# Patient Record
Sex: Male | Born: 1980 | Race: Black or African American | Hispanic: No | Marital: Single | Smoking: Current some day smoker
Health system: Southern US, Community
[De-identification: ages and names within clinical notes are randomized; demographics above are authoritative.]

---

## 2005-07-12 ENCOUNTER — Emergency Department: Payer: Self-pay | Admitting: Emergency Medicine

## 2007-03-04 ENCOUNTER — Emergency Department: Payer: Self-pay | Admitting: Emergency Medicine

## 2007-03-15 ENCOUNTER — Emergency Department: Payer: Self-pay | Admitting: Emergency Medicine

## 2007-08-12 ENCOUNTER — Emergency Department: Payer: Self-pay | Admitting: Emergency Medicine

## 2007-08-20 ENCOUNTER — Emergency Department: Payer: Self-pay | Admitting: Emergency Medicine

## 2007-11-27 ENCOUNTER — Emergency Department: Payer: Self-pay | Admitting: Internal Medicine

## 2008-02-19 ENCOUNTER — Emergency Department: Payer: Self-pay | Admitting: Emergency Medicine

## 2008-02-27 ENCOUNTER — Emergency Department: Payer: Self-pay | Admitting: Emergency Medicine

## 2008-03-05 ENCOUNTER — Emergency Department: Payer: Self-pay | Admitting: Emergency Medicine

## 2008-05-04 ENCOUNTER — Emergency Department: Payer: Self-pay | Admitting: Emergency Medicine

## 2008-06-13 ENCOUNTER — Emergency Department: Payer: Self-pay | Admitting: Unknown Physician Specialty

## 2008-09-19 ENCOUNTER — Emergency Department: Payer: Self-pay | Admitting: Emergency Medicine

## 2009-02-01 ENCOUNTER — Emergency Department: Payer: Self-pay | Admitting: Emergency Medicine

## 2012-05-12 ENCOUNTER — Emergency Department: Payer: Self-pay | Admitting: Emergency Medicine

## 2012-05-14 ENCOUNTER — Emergency Department: Payer: Self-pay | Admitting: Internal Medicine

## 2014-09-30 ENCOUNTER — Emergency Department: Payer: Self-pay

## 2014-09-30 ENCOUNTER — Emergency Department
Admission: EM | Admit: 2014-09-30 | Discharge: 2014-09-30 | Disposition: A | Discharge status: Home / Self Care | Payer: Self-pay | Attending: Emergency Medicine | Admitting: Emergency Medicine

## 2014-09-30 ENCOUNTER — Encounter: Payer: Self-pay | Admitting: Emergency Medicine

## 2014-09-30 DIAGNOSIS — Y9289 Other specified places as the place of occurrence of the external cause: Secondary | ICD-10-CM | POA: Insufficient documentation

## 2014-09-30 DIAGNOSIS — Y9389 Activity, other specified: Secondary | ICD-10-CM | POA: Insufficient documentation

## 2014-09-30 DIAGNOSIS — Y998 Other external cause status: Secondary | ICD-10-CM | POA: Insufficient documentation

## 2014-09-30 DIAGNOSIS — S301XXA Contusion of abdominal wall, initial encounter: Secondary | ICD-10-CM | POA: Insufficient documentation

## 2014-09-30 DIAGNOSIS — Z720 Tobacco use: Secondary | ICD-10-CM | POA: Insufficient documentation

## 2014-09-30 LAB — COMPREHENSIVE METABOLIC PANEL
ALK PHOS: 55 U/L (ref 38–126)
ALT: 15 U/L — ABNORMAL LOW (ref 17–63)
ANION GAP: 6 (ref 5–15)
AST: 25 U/L (ref 15–41)
Albumin: 3.5 g/dL (ref 3.5–5.0)
BUN: 10 mg/dL (ref 6–20)
CO2: 27 mmol/L (ref 22–32)
Calcium: 8.3 mg/dL — ABNORMAL LOW (ref 8.9–10.3)
Chloride: 103 mmol/L (ref 101–111)
Creatinine, Ser: 1 mg/dL (ref 0.61–1.24)
GFR calc Af Amer: >60 mL/min (ref >60)
GFR calc non Af Amer: >60 mL/min (ref >60)
Glucose, Bld: 87 mg/dL (ref 65–99)
POTASSIUM: 4 mmol/L (ref 3.5–5.1)
SODIUM: 136 mmol/L (ref 135–145)
Total Bilirubin: 0.3 mg/dL (ref 0.3–1.2)
Total Protein: 5.7 g/dL — ABNORMAL LOW (ref 6.5–8.1)

## 2014-09-30 LAB — CBC WITH DIFFERENTIAL/PLATELET
Basophils Absolute: 0 10*3/uL (ref 0–0.1)
Basophils Relative: 0 %
EOS ABS: 0.1 10*3/uL (ref 0–0.7)
Eosinophils Relative: 1 %
HCT: 41.9 % (ref 40.0–52.0)
HEMOGLOBIN: 13.7 g/dL (ref 13.0–18.0)
Lymphocytes Relative: 29 %
Lymphs Abs: 3.2 10*3/uL (ref 1.0–3.6)
MCH: 28.6 pg (ref 26.0–34.0)
MCHC: 32.7 g/dL (ref 32.0–36.0)
MCV: 87.6 fL (ref 80.0–100.0)
MONOS PCT: 5 %
Monocytes Absolute: 0.5 10*3/uL (ref 0.2–1.0)
NEUTROS ABS: 7.4 10*3/uL — AB (ref 1.4–6.5)
Neutrophils Relative %: 65 %
PLATELETS: 269 10*3/uL (ref 150–440)
RBC: 4.78 MIL/uL (ref 4.40–5.90)
RDW: 14.6 % — ABNORMAL HIGH (ref 11.5–14.5)
WBC: 11.3 10*3/uL — AB (ref 3.8–10.6)

## 2014-09-30 MED ORDER — LEVOFLOXACIN IN D5W 750 MG/150ML IV SOLN: 750.0000 mg | Freq: Once | INTRAVENOUS | Status: DC

## 2014-09-30 MED ORDER — IOHEXOL 300 MG/ML  SOLN
100.0000 mL | Freq: Once | INTRAMUSCULAR | Status: AC | PRN
  Administered 2014-09-30: 100 mL via INTRAVENOUS

## 2014-09-30 MED ORDER — IOHEXOL 240 MG/ML SOLN: 25.0000 mL | Freq: Once | INTRAMUSCULAR | Status: DC | PRN

## 2014-09-30 MED ORDER — IBUPROFEN 800 MG PO TABS: 800.0000 mg | ORAL_TABLET | Freq: Three times a day (TID) | ORAL | Status: DC | PRN
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
## 2014-09-30 NOTE — Discharge Instructions (Signed)
Hematoma A hematoma is a collection of blood under the skin, in an organ, in a body space, in a joint space, or in other tissue. The blood can clot to form a lump that you can see and feel. The lump is often firm and may sometimes become sore and tender. Most hematomas get better in a few days to weeks. However, some hematomas may be serious and require medical care. Hematomas can range in size from very small to very large. CAUSES  A hematoma can be caused by a blunt or penetrating injury. It can also be caused by spontaneous leakage from a blood vessel under the skin. Spontaneous leakage from a blood vessel is more likely to occur in older people, especially those taking blood thinners. Sometimes, a hematoma can develop after certain medical procedures. SIGNS AND SYMPTOMS   A firm lump on the body.  Possible pain and tenderness in the area.  Bruising.Blue, dark blue, purple-red, or yellowish skin may appear at the site of the hematoma if the hematoma is close to the surface of the skin. For hematomas in deeper tissues or body spaces, the signs and symptoms may be subtle. For example, an intra-abdominal hematoma may cause abdominal pain, weakness, fainting, and shortness of breath. An intracranial hematoma may cause a headache or symptoms such as weakness, trouble speaking, or a change in consciousness. DIAGNOSIS  A hematoma can usually be diagnosed based on your medical history and a physical exam. Imaging tests may be needed if your health care provider suspects a hematoma in deeper tissues or body spaces, such as the abdomen, head, or chest. These tests may include ultrasonography or a CT scan.  TREATMENT  Hematomas usually go away on their own over time. Rarely does the blood need to be drained out of the body. Large hematomas or those that may affect vital organs will sometimes need surgical drainage or monitoring. HOME CARE INSTRUCTIONS   Apply ice to the injured area:   Put ice in a  plastic bag.   Place a towel between your skin and the bag.   Leave the ice on for 20 minutes, 2-3 times a day for the first 1 to 2 days.   After the first 2 days, switch to using warm compresses on the hematoma.   Elevate the injured area to help decrease pain and swelling. Wrapping the area with an elastic bandage may also be helpful. Compression helps to reduce swelling and promotes shrinking of the hematoma. Make sure the bandage is not wrapped too tight.   If your hematoma is on a lower extremity and is painful, crutches may be helpful for a couple days.   Only take over-the-counter or prescription medicines as directed by your health care provider. SEEK IMMEDIATE MEDICAL CARE IF:   You have increasing pain, or your pain is not controlled with medicine.   You have a fever.   You have worsening swelling or discoloration.   Your skin over the hematoma breaks or starts bleeding.   Your hematoma is in your chest or abdomen and you have weakness, shortness of breath, or a change in consciousness.  Your hematoma is on your scalp (caused by a fall or injury) and you have a worsening headache or a change in alertness or consciousness. MAKE SURE YOU:   Understand these instructions.  Will watch your condition.  Will get help right away if you are not doing well or get worse. Document Released: 10/06/2003 Document Revised: 10/24/2012 Document Reviewed: 08/01/2012   [employer] Patient Information 2015 [place], [place]. This information is not intended to replace advice given to you by your health care provider. Make sure you discuss any questions you have with your health care provider.  Blunt Abdominal Trauma A blunt injury to the abdomen can cause pain. The pain is most likely from bruising and stretching of your muscles. This pain is often made worse with movement. Most often these injuries are not serious and get better within 1 week with rest and mild pain medicine. However,  internal organs (liver, spleen, kidneys) can be injured with blunt trauma. If you do not get better or if you get worse, further examination may be needed. Continue with your regular daily activities, but avoid any strenuous activities until your pain is improved. If your stomach is upset, stick to a clear liquid diet and slowly advance to solid food.  SEEK IMMEDIATE MEDICAL CARE IF:   You develop increasing pain, nausea, or repeated vomiting.  You develop chest pain or breathing difficulty.  You develop blood in the urine, vomit, or stool.  You develop weakness, fainting, fever, or other serious complaints. Document Released: 03/31/2004 Document Revised: 05/16/2011 Document Reviewed: 07/17/2008 [facility] Patient Information 2015 [place], [place]. This information is not intended to replace advice given to you by your health care provider. Make sure you discuss any questions you have with your health care provider.
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
## 2016-01-23 ENCOUNTER — Encounter: Payer: Self-pay | Admitting: Emergency Medicine

## 2016-01-23 ENCOUNTER — Emergency Department
Admission: EM | Admit: 2016-01-23 | Discharge: 2016-01-23 | Disposition: A | Discharge status: Home / Self Care | Payer: Self-pay | Attending: Emergency Medicine | Admitting: Emergency Medicine

## 2016-01-23 ENCOUNTER — Emergency Department: Payer: Self-pay

## 2016-01-23 DIAGNOSIS — Z791 Long term (current) use of non-steroidal anti-inflammatories (NSAID): Secondary | ICD-10-CM | POA: Insufficient documentation

## 2016-01-23 DIAGNOSIS — F17200 Nicotine dependence, unspecified, uncomplicated: Secondary | ICD-10-CM | POA: Insufficient documentation

## 2016-01-23 DIAGNOSIS — M2141 Flat foot [pes planus] (acquired), right foot: Secondary | ICD-10-CM | POA: Insufficient documentation

## 2016-01-23 DIAGNOSIS — M2142 Flat foot [pes planus] (acquired), left foot: Secondary | ICD-10-CM | POA: Insufficient documentation

## 2016-01-23 DIAGNOSIS — M79672 Pain in left foot: Secondary | ICD-10-CM

## 2016-01-23 MED ORDER — MELOXICAM 15 MG PO TABS: 15.0000 mg | ORAL_TABLET | Freq: Every day | ORAL | 0 refills | Status: DC

## 2016-01-23 NOTE — ED Triage Notes (Signed)
Pt ambulatory in triage states he has ankle pain that started about 2-3 weeks ago. Pain worse when going from sitting to standing. Pt denies any injury. Pt has compression brace on currently. Using ibuprofen occassionally

## 2016-01-23 NOTE — ED Notes (Signed)
Pt verbalized understanding of discharge instructions. NAD at this time. 

## 2016-01-23 NOTE — ED Provider Notes (Signed)
[facility] Emergency Department Provider Note  ____________________________________________  Time seen: Approximately 6:12 PM  I have reviewed the triage vital signs and the nursing notes.   HISTORY  Chief Complaint Ankle Pain    HPI Nathaniel Freeman is a 35 y.o. male who presents emergency department complaining of left foot pain. Patient states that he has "very flat feet" and he has noticed that over the past several months he has had increasing foot pain. Patient states that he has not had any known injury to the area. He reports that pain increases after standing for a long period of time. Patient has not tried any medication or orthotics for this complaint. He has not seen a podiatrist.   History reviewed. No pertinent past medical history.  There are no active problems to display for this patient.   History reviewed. No pertinent surgical history.  Prior to Admission medications   Medication Sig Start Date End Date Taking? Authorizing Provider  ibuprofen (ADVIL,MOTRIN) 800 MG tablet Take 1 tablet (800 mg total) by mouth every 8 (eight) hours as needed. 09/30/14   Charmayne Sheerharles M Beers, PA-C  meloxicam (MOBIC) 15 MG tablet Take 1 tablet (15 mg total) by mouth daily. 01/23/16   Delorise RoyalsJonathan D Jadie Comas, PA-C    Allergies Patient has no known allergies.  History reviewed. No pertinent family history.  Social History Social History  Substance Use Topics  . Smoking status: Current Every Day Smoker    Packs/day: 0.50  . Smokeless tobacco: Never Used  . Alcohol use Yes     Comment: occasional     Review of Systems  Constitutional: No fever/chills Cardiovascular: no chest pain. Respiratory: no cough. No SOB. Musculoskeletal: Positive for left foot pain Skin: Negative for rash, abrasions, lacerations, ecchymosis. Neurological: Negative for headaches, focal weakness or numbness. 10-point ROS otherwise  negative.  ____________________________________________   PHYSICAL EXAM:  VITAL SIGNS: ED Triage Vitals  Enc Vitals Group     BP 01/23/16 1653 116/73     Pulse Rate 01/23/16 1653 69     Resp 01/23/16 1653 18     Temp 01/23/16 1653 97.9 F (36.6 C)     Temp Source 01/23/16 1653 Oral     SpO2 01/23/16 1653 100 %     Weight 01/23/16 1654 193 lb (87.5 kg)     Height 01/23/16 1654 6\' 1"  (1.854 m)     Head Circumference --      Peak Flow --      Pain Score 01/23/16 1654 8     Pain Loc --      Pain Edu? --      Excl. in GC? --      Constitutional: Alert and oriented. Well appearing and in no acute distress. Eyes: Conjunctivae are normal. PERRL. EOMI. Head: Atraumatic. Cardiovascular: Normal rate, regular rhythm. Normal S1 and S2.  Good peripheral circulation. Respiratory: Normal respiratory effort without tachypnea or retractions. Lungs CTAB. Good air entry to the bases with no decreased or absent breath sounds. Musculoskeletal: Full range of motion to all extremities. No gross deformities appreciated.Patient's the are extremely flattening 2. Little to no arch is appreciated. Patient is nontender to palpation at this time. No deformity or edema. Dorsalis pedis pulse and cap refill intact 5 digits. Sensation intact 5 digits. Neurologic:  Normal speech and language. No gross focal neurologic deficits are appreciated.  Skin:  Skin is warm, dry and intact. No rash noted. Psychiatric: Mood and affect are normal. Speech and behavior  are normal. Patient exhibits appropriate insight and judgement.   ____________________________________________   LABS (all labs ordered are listed, but only abnormal results are displayed)  Labs Reviewed - No data to display ____________________________________________  EKG   ____________________________________________  RADIOLOGY Festus BarrenI, Soledad Budreau D Yessica Putnam, personally viewed and evaluated these images (plain radiographs) as part of my medical  decision making, as well as reviewing the written report by the radiologist.  Dg Ankle Complete Left  Result Date: 01/23/2016 CLINICAL DATA:  No known injury.  Pain for 3 weeks with walking. EXAM: LEFT ANKLE COMPLETE - 3+ VIEW COMPARISON:  None. FINDINGS: There is no evidence of fracture, dislocation, or joint effusion. There is no evidence of arthropathy or other focal bone abnormality. Soft tissues are unremarkable. IMPRESSION: No acute osseous injury of the left ankle. Electronically Signed   By: Elige KoHetal  Patel   On: 01/23/2016 17:45    ____________________________________________    PROCEDURES  Procedure(s) performed:    Procedures    Medications - No data to display   ____________________________________________   INITIAL IMPRESSION / ASSESSMENT AND PLAN / ED COURSE  Pertinent labs & imaging results that were available during my care of the patient were reviewed by me and considered in my medical decision making (see chart for details).  Review of the [place] CSRS was performed in accordance of the [facility] prior to dispensing any controlled drugs.  Clinical Course     Patient's diagnosis is consistent with Pes planus and left foot pain. Patient has extremely flat feet and has noticed increasing symptoms over the past several months. This is likely attributed to pes planus. X-ray reveals no acute osseous abnormality. Exam is reassuring. Patient will be referred to podiatry for further evaluation and management of chronic condition. Patient is given anti-inflammatories at this time for symptom control..  Patient is given ED precautions to return to the ED for any worsening or new symptoms.     ____________________________________________  FINAL CLINICAL IMPRESSION(S) / ED DIAGNOSES  Final diagnoses:  Pes planus of both feet  Foot pain, left      NEW MEDICATIONS STARTED DURING THIS VISIT:  New Prescriptions   MELOXICAM (MOBIC) 15 MG TABLET    Take 1 tablet (15 mg  total) by mouth daily.        This chart was dictated using voice recognition software/Dragon. Despite best efforts to proofread, errors can occur which can change the meaning. Any change was purely unintentional.    Racheal PatchesJonathan D Garik Diamant, PA-C 01/23/16 Rickey [id number]    Sharman CheekPhillip Stafford, MD 02/02/16 [phone]

## 2016-02-15 ENCOUNTER — Emergency Department
Admission: EM | Admit: 2016-02-15 | Discharge: 2016-02-15 | Disposition: A | Discharge status: Home / Self Care | Payer: Self-pay | Attending: Emergency Medicine | Admitting: Emergency Medicine

## 2016-02-15 DIAGNOSIS — F17200 Nicotine dependence, unspecified, uncomplicated: Secondary | ICD-10-CM | POA: Insufficient documentation

## 2016-02-15 DIAGNOSIS — H109 Unspecified conjunctivitis: Secondary | ICD-10-CM

## 2016-02-15 DIAGNOSIS — Z79899 Other long term (current) drug therapy: Secondary | ICD-10-CM | POA: Insufficient documentation

## 2016-02-15 DIAGNOSIS — Z791 Long term (current) use of non-steroidal anti-inflammatories (NSAID): Secondary | ICD-10-CM | POA: Insufficient documentation

## 2016-02-15 DIAGNOSIS — H1089 Other conjunctivitis: Secondary | ICD-10-CM | POA: Insufficient documentation

## 2016-02-15 MED ORDER — POLYMYXIN B-TRIMETHOPRIM 10000-0.1 UNIT/ML-% OP SOLN: 2.0000 [drp] | Freq: Four times a day (QID) | OPHTHALMIC | 0 refills | Status: DC

## 2016-02-15 NOTE — ED Notes (Signed)
Pt reports he thinks he has pink eye - he has had inflammation of the right eyelid with drainage for the past 2 days - pt states his eyes feel like there is sand in them - he reports having pink eye 3 years ago and this feels the same

## 2016-02-15 NOTE — ED Triage Notes (Signed)
Pt presents to ED with c/o possible conjunctivitis. Pt reports yesterday RIGHT eye became painful with itching, redness, and yellow drainage. Pt denies any c/o N/V/D or fever. Pt's RIGHT eye is red and slightly swollen.

## 2016-02-15 NOTE — ED Provider Notes (Signed)
[facility] Emergency Department Provider Note  ____________________________________________  Time seen: Approximately 9:15 PM  I have reviewed the triage vital signs and the nursing notes.   HISTORY  Chief Complaint Conjunctivitis    HPI Nathaniel Freeman is a 35 y.o. male who presents emergency department complaining of right eye redness, irritation, draining purulent matter. Patient states symptoms have been ongoing 2 days. He denies any visual changes. He does wear glasses but no contacts. No trauma to the eye. No other complaints.   History reviewed. No pertinent past medical history.  There are no active problems to display for this patient.   History reviewed. No pertinent surgical history.  Prior to Admission medications   Medication Sig Start Date End Date Taking? Authorizing Provider  ibuprofen (ADVIL,MOTRIN) 800 MG tablet Take 1 tablet (800 mg total) by mouth every 8 (eight) hours as needed. 09/30/14   Charmayne Sheerharles M Beers, PA-C  meloxicam (MOBIC) 15 MG tablet Take 1 tablet (15 mg total) by mouth daily. 01/23/16   Delorise RoyalsJonathan D Zylon Creamer, PA-C  trimethoprim-polymyxin b (POLYTRIM) ophthalmic solution Place 2 drops into the right eye every 6 (six) hours. 02/15/16   Delorise RoyalsJonathan D Micheal Murad, PA-C    Allergies Patient has no known allergies.  No family history on file.  Social History Social History  Substance Use Topics  . Smoking status: Current Every Day Smoker    Packs/day: 0.50  . Smokeless tobacco: Never Used  . Alcohol use Yes     Comment: occasional     Review of Systems  Constitutional: No fever/chills Eyes: No visual changes. Positive for right eye redness, irritation, purulent discharge ENT: No upper respiratory complaints. Cardiovascular: no chest pain. Respiratory: no cough. No SOB. Musculoskeletal: Negative for musculoskeletal pain. Skin: Negative for rash, abrasions, lacerations, ecchymosis. Neurological: Negative for headaches,  focal weakness or numbness. 10-point ROS otherwise negative.  ____________________________________________   PHYSICAL EXAM:  VITAL SIGNS: ED Triage Vitals  Enc Vitals Group     BP 02/15/16 2056 125/84     Pulse Rate 02/15/16 2056 95     Resp 02/15/16 2056 18     Temp 02/15/16 2056 98.3 F (36.8 C)     Temp Source 02/15/16 2056 Oral     SpO2 02/15/16 2056 96 %     Weight 02/15/16 2057 195 lb (88.5 kg)     Height 02/15/16 2057 6\' 1"  (1.854 m)     Head Circumference --      Peak Flow --      Pain Score 02/15/16 2104 7     Pain Loc --      Pain Edu? --      Excl. in GC? --      Constitutional: Alert and oriented. Well appearing and in no acute distress. Eyes: Conjunctiva on right is erythematous. Dried purulent drainage noted to lower eyelashes. Funduscopic exam is unremarkable bilaterally.Marland Kitchen. PERRL. EOMI. Head: Atraumatic. ENT:      Ears:       Nose: No congestion/rhinnorhea.      Mouth/Throat: Mucous membranes are moist.  Neck: No stridor.   Cardiovascular: Normal rate, regular rhythm. Normal S1 and S2.  Good peripheral circulation. Respiratory: Normal respiratory effort without tachypnea or retractions. Lungs CTAB. Good air entry to the bases with no decreased or absent breath sounds. Musculoskeletal: Full range of motion to all extremities. No gross deformities appreciated. Neurologic:  Normal speech and language. No gross focal neurologic deficits are appreciated.  Skin:  Skin is warm, dry and  intact. No rash noted. Psychiatric: Mood and affect are normal. Speech and behavior are normal. Patient exhibits appropriate insight and judgement.   ____________________________________________   LABS (all labs ordered are listed, but only abnormal results are displayed)  Labs Reviewed - No data to display ____________________________________________  EKG   ____________________________________________  RADIOLOGY   No results  found.  ____________________________________________    PROCEDURES  Procedure(s) performed:    Procedures    Medications - No data to display   ____________________________________________   INITIAL IMPRESSION / ASSESSMENT AND PLAN / ED COURSE  Pertinent labs & imaging results that were available during my care of the patient were reviewed by me and considered in my medical decision making (see chart for details).  Review of the [place] CSRS was performed in accordance of the [facility] prior to dispensing any controlled drugs.  Clinical Course     Patient's diagnosis is consistent with Pectoral conjunctivitis to the right eye.. Patient will be discharged home with prescriptions for antibiotic eyedrops. Patient is to follow up with primary care as needed or otherwise directed. Patient is given ED precautions to return to the ED for any worsening or new symptoms.     ____________________________________________  FINAL CLINICAL IMPRESSION(S) / ED DIAGNOSES  Final diagnoses:  Bacterial conjunctivitis of right eye      NEW MEDICATIONS STARTED DURING THIS VISIT:  New Prescriptions   TRIMETHOPRIM-POLYMYXIN B (POLYTRIM) OPHTHALMIC SOLUTION    Place 2 drops into the right eye every 6 (six) hours.        This chart was dictated using voice recognition software/Dragon. Despite best efforts to proofread, errors can occur which can change the meaning. Any change was purely unintentional.    Racheal PatchesJonathan D Samul Mcinroy, PA-C 02/15/16 2122    Rockne MenghiniAnne-Caroline Norman, MD 02/15/16 2207

## 2016-08-28 ENCOUNTER — Emergency Department
Admission: EM | Admit: 2016-08-28 | Discharge: 2016-08-28 | Disposition: A | Discharge status: Home / Self Care | Payer: Self-pay | Attending: Emergency Medicine | Admitting: Emergency Medicine

## 2016-08-28 ENCOUNTER — Encounter: Payer: Self-pay | Admitting: Emergency Medicine

## 2016-08-28 ENCOUNTER — Emergency Department: Payer: Self-pay

## 2016-08-28 DIAGNOSIS — Y9339 Activity, other involving climbing, rappelling and jumping off: Secondary | ICD-10-CM | POA: Insufficient documentation

## 2016-08-28 DIAGNOSIS — S838X2A Sprain of other specified parts of left knee, initial encounter: Secondary | ICD-10-CM

## 2016-08-28 DIAGNOSIS — S8392XA Sprain of unspecified site of left knee, initial encounter: Secondary | ICD-10-CM | POA: Insufficient documentation

## 2016-08-28 DIAGNOSIS — F17210 Nicotine dependence, cigarettes, uncomplicated: Secondary | ICD-10-CM | POA: Insufficient documentation

## 2016-08-28 DIAGNOSIS — W1789XA Other fall from one level to another, initial encounter: Secondary | ICD-10-CM | POA: Insufficient documentation

## 2016-08-28 DIAGNOSIS — Y929 Unspecified place or not applicable: Secondary | ICD-10-CM | POA: Insufficient documentation

## 2016-08-28 DIAGNOSIS — Y999 Unspecified external cause status: Secondary | ICD-10-CM | POA: Insufficient documentation

## 2016-08-28 MED ORDER — NAPROXEN 500 MG PO TABS: 500.0000 mg | ORAL_TABLET | Freq: Two times a day (BID) | ORAL | 0 refills | Status: AC

## 2016-08-28 NOTE — ED Provider Notes (Signed)
[facility] Emergency Department Provider Note ____________________________________________  Time seen: 0715  I have reviewed the triage vital signs and the nursing notes.  HISTORY  Chief Complaint  Knee Pain  HPI [place] is a 36 y.o. male presented to the ED for evaluationof left knee pain and disability. Patient describes jumping off of a platform, and landed with his left knee in a valgus position. Since that time he's had intermittent give way discomfort and pain to the lateral left knee. He denies any swelling, deformity, or dislocation. He denies any other injury at this time. He presents now with intermittent left knee pain and disability.  History reviewed. No pertinent past medical history.  There are no active problems to display for this patient.  History reviewed. No pertinent surgical history.  Prior to Admission medications   Medication Sig Start Date End Date Taking? Authorizing Provider  naproxen (NAPROSYN) 500 MG tablet Take 1 tablet (500 mg total) by mouth 2 (two) times daily with a meal. 08/28/16 09/27/16  Caroll Weinheimer, Charlesetta Ivory, PA-C    Allergies Patient has no known allergies.  History reviewed. No pertinent family history.  Social History Social History  Substance Use Topics  . Smoking status: Current Every Day Smoker    Packs/day: 0.50    Types: Cigarettes  . Smokeless tobacco: Never Used  . Alcohol use Yes     Comment: occasional    Review of Systems  Constitutional: Negative for fever. Cardiovascular: Negative for chest pain. Respiratory: Negative for shortness of breath. Musculoskeletal: Negative for back pain. Left knee pain as above. Skin: Negative for rash. Neurological: Negative for headaches, focal weakness or numbness. ____________________________________________  PHYSICAL EXAM:  VITAL SIGNS: ED Triage Vitals  Enc Vitals Group     BP 08/28/16 0520 124/78     Pulse Rate 08/28/16 0520 95     Resp  08/28/16 0520 18     Temp 08/28/16 0520 99.1 F (37.3 C)     Temp Source 08/28/16 0520 Oral     SpO2 08/28/16 0520 100 %     Weight 08/28/16 0521 200 lb (90.7 kg)     Height 08/28/16 0521 6' (1.829 m)     Head Circumference --      Peak Flow --      Pain Score 08/28/16 0520 10     Pain Loc --      Pain Edu? --      Excl. in GC? --     Constitutional: Alert and oriented. Well appearing and in no distress. Head: Normocephalic and atraumatic. Cardiovascular: Normal rate, regular rhythm. Normal distal pulses. Respiratory: Normal respiratory effort. No wheezes/rales/rhonchi. Musculoskeletal: Left knee without obvious deformity, dislocation, or joint effusion. The patient were essentially normal range of motion in all planes. Negative anterior posterior drawer. No significant valgus or varus stress stresses appreciated. No popliteal space fullness or tenderness noted. Patient is with minimal tenderness to palpation to the lateral aspect of the left knee. Nontender with normal range of motion in all extremities. Attempts to stand and bear weight and walk elicits give-way pain to the lateral knee. Neurologic:  Antalgic gait without ataxia. Normal speech and language. No gross focal neurologic deficits are appreciated. Skin:  Skin is warm, dry and intact. No rash noted. ____________________________________________   RADIOLOGY  Left Knee  IMPRESSION: Negative. ____________________________________________  PROCEDURES  Ace bandage Knee immobilizer ____________________________________________  INITIAL IMPRESSION / ASSESSMENT AND PLAN / ED COURSE  Patient with acute left knee pain  with indication of possible medial meniscus strain versus small tear. The patient is placed in Ace and knee immobilizer for support. He is intermittent give way pain with attempts to bear weight. He'll be discharged with instructions to follow-up with orthophoric ongoing symptom management. He'll be discharged  with a prescription for naproxen to dose as directed. He is to rest and apply ice to the knee for comfort. Work note is provided for today as requested. ____________________________________________  FINAL CLINICAL IMPRESSION(S) / ED DIAGNOSES  Final diagnoses:  Sprain of other ligament of left knee, initial encounter     Lissa HoardMenshew, Quynn Vilchis V Bacon, PA-C 08/28/16 1549    Governor RooksLord, Rebecca, MD 08/28/16 [phone]

## 2016-08-28 NOTE — ED Triage Notes (Signed)
Patient ambulatory to triage. Patient states that he jumped off of something and twisted his left knee. Patient with complaint of pain to left knee.

## 2016-08-28 NOTE — ED Notes (Signed)
Pt to xray with radiology tech[facility]

## 2016-08-28 NOTE — ED Notes (Signed)
See triage note  States he jumped off something and twisted left knee ambulates with slight limp  No deformity noted

## 2016-08-28 NOTE — ED Notes (Signed)
Patient transported to X-ray 

## 2016-08-28 NOTE — ED Triage Notes (Signed)
Patient ambulating outside. 

## 2016-08-28 NOTE — Discharge Instructions (Signed)
Your x-ray is negative for any fracture or dislocation. Wear the knee immobilizer for support. Take the prescription as directed. Follow-up with ortho for continued symptoms. [facility]. Return as needed.

## 2016-11-26 ENCOUNTER — Emergency Department
Admission: EM | Admit: 2016-11-26 | Discharge: 2016-11-26 | Disposition: A | Discharge status: Home / Self Care | Payer: Self-pay | Attending: Emergency Medicine | Admitting: Emergency Medicine

## 2016-11-26 ENCOUNTER — Emergency Department: Payer: Self-pay

## 2016-11-26 ENCOUNTER — Encounter: Payer: Self-pay | Admitting: Emergency Medicine

## 2016-11-26 DIAGNOSIS — Y929 Unspecified place or not applicable: Secondary | ICD-10-CM | POA: Insufficient documentation

## 2016-11-26 DIAGNOSIS — S8392XA Sprain of unspecified site of left knee, initial encounter: Secondary | ICD-10-CM | POA: Insufficient documentation

## 2016-11-26 DIAGNOSIS — Y9367 Activity, basketball: Secondary | ICD-10-CM | POA: Insufficient documentation

## 2016-11-26 DIAGNOSIS — Y999 Unspecified external cause status: Secondary | ICD-10-CM | POA: Insufficient documentation

## 2016-11-26 DIAGNOSIS — F17210 Nicotine dependence, cigarettes, uncomplicated: Secondary | ICD-10-CM | POA: Insufficient documentation

## 2016-11-26 DIAGNOSIS — X501XXA Overexertion from prolonged static or awkward postures, initial encounter: Secondary | ICD-10-CM | POA: Insufficient documentation

## 2016-11-26 MED ORDER — MELOXICAM 7.5 MG PO TABS: 7.5000 mg | ORAL_TABLET | Freq: Every day | ORAL | 1 refills | Status: AC

## 2016-11-26 NOTE — ED Provider Notes (Signed)
[facility] Emergency Department Provider Note  ____________________________________________  Time seen: Approximately 8:17 PM  I have reviewed the triage vital signs and the nursing notes.   HISTORY  Chief Complaint Knee Pain    HPI Nathaniel Freeman is a 36 y.o. male presents to the emergency department with left knee pain after patient states that he sustained a twisting injury while playing basketball. Patient did not fall. Patient denies weakness, radiculopathy or changes in sensation in the left lower extremity. No alleviating measures have been attempted.    History reviewed. No pertinent past medical history.  There are no active problems to display for this patient.   History reviewed. No pertinent surgical history.  Prior to Admission medications   Medication Sig Start Date End Date Taking? Authorizing Provider  meloxicam (MOBIC) 7.5 MG tablet Take 1 tablet (7.5 mg total) by mouth daily. 11/26/16 12/03/16  Orvil Feil, PA-C    Allergies Patient has no known allergies.  No family history on file.  Social History Social History  Substance Use Topics  . Smoking status: Current Every Day Smoker    Packs/day: 0.20    Types: Cigarettes  . Smokeless tobacco: Never Used  . Alcohol use Yes     Comment: occasional     Review of Systems  Constitutional: No fever/chills Eyes: No visual changes. No discharge ENT: No upper respiratory complaints. Cardiovascular: no chest pain. Respiratory: no cough. No SOB. Gastrointestinal: No abdominal pain.  No nausea, no vomiting.  No diarrhea.  No constipation. Musculoskeletal: Patient has left knee pain.  Skin: Negative for rash, abrasions, lacerations, ecchymosis. Neurological: Negative for headaches, focal weakness or numbness.   ____________________________________________   PHYSICAL EXAM:  VITAL SIGNS: ED Triage Vitals  Enc Vitals Group     BP 11/26/16 1727 (!) 132/93     Pulse Rate  11/26/16 1727 (!) 106     Resp 11/26/16 1727 16     Temp 11/26/16 1727 99.4 F (37.4 C)     Temp Source 11/26/16 1727 Oral     SpO2 11/26/16 1727 98 %     Weight 11/26/16 1728 195 lb (88.5 kg)     Height 11/26/16 1728 6' (1.829 m)     Head Circumference --      Peak Flow --      Pain Score 11/26/16 1730 10     Pain Loc --      Pain Edu? --      Excl. in GC? --      Constitutional: Alert and oriented. Well appearing and in no acute distress. Eyes: Conjunctivae are normal. PERRL. EOMI. Head: Atraumatic. Cardiovascular: Normal rate, regular rhythm. Normal S1 and S2.  Good peripheral circulation. Respiratory: Normal respiratory effort without tachypnea or retractions. Lungs CTAB. Good air entry to the bases with no decreased or absent breath sounds. Musculoskeletal: Full range of motion to all extremities. Left knee: Negative anterior and posterior drawer test. No laxity of the MCL or LCL testing. Negative ballottement. Palpable dorsalis pedis pulses, left. No gross deformities appreciated. Neurologic:  Normal speech and language. No gross focal neurologic deficits are appreciated.  Skin:  Skin is warm, dry and intact. No rash noted. Psychiatric: Mood and affect are normal. Speech and behavior are normal. Patient exhibits appropriate insight and judgement.   ____________________________________________   LABS (all labs ordered are listed, but only abnormal results are displayed)  Labs Reviewed - No data to display ____________________________________________  EKG   ____________________________________________  RADIOLOGY I,  Orvil Feil, personally viewed and evaluated these images (plain radiographs) as part of my medical decision making, as well as reviewing the written report by the radiologist.  Dg Knee Complete 4 Views Left  Result Date: 11/26/2016 CLINICAL DATA:  Twisting injury with pain. EXAM: LEFT KNEE - COMPLETE 4+ VIEW COMPARISON:  None. FINDINGS: No acute  fracture or dislocation. Joint spaces are well maintained. Small suprapatellar joint effusion. IMPRESSION: Small joint effusion.  No acute fracture or dislocation. Electronically Signed   By: Mitzi Hansen M.D.   On: 11/26/2016 19:28    ____________________________________________    PROCEDURES  Procedure(s) performed:    Procedures    Medications - No data to display   ____________________________________________   INITIAL IMPRESSION / ASSESSMENT AND PLAN / ED COURSE  Pertinent labs & imaging results that were available during my care of the patient were reviewed by me and considered in my medical decision making (see chart for details).  Review of the [place] CSRS was performed in accordance of the [facility] prior to dispensing any controlled drugs.    Assessment and plan Left knee sprain Patient presents the emergency department with left knee pain after a twisting type injury. X-ray examination reveals no acute fractures or bony abnormalities. An ace wrap was applied in the emergency department and patient was discharged with meloxicam. Patient was referred to orthopedics, Dr. Odis Luster. Vital signs were reassuring prior to discharge. All patient questions were answered.   ____________________________________________  FINAL CLINICAL IMPRESSION(S) / ED DIAGNOSES  Final diagnoses:  Sprain of left knee, unspecified ligament, initial encounter      NEW MEDICATIONS STARTED DURING THIS VISIT:  New Prescriptions   MELOXICAM (MOBIC) 7.5 MG TABLET    Take 1 tablet (7.5 mg total) by mouth daily.        This chart was dictated using voice recognition software/Dragon. Despite best efforts to proofread, errors can occur which can change the meaning. Any change was purely unintentional.    Gasper Lloyd 11/26/16 2108    Merrily Brittle, MD 11/26/16 [phone]

## 2016-11-26 NOTE — ED Notes (Signed)
AAOx3.  Skin warm and dry.  NAD 

## 2016-11-26 NOTE — ED Triage Notes (Signed)
States twisted L knee today, painful. History of injury same knee.

## 2017-05-13 ENCOUNTER — Emergency Department: Payer: Self-pay

## 2017-05-13 ENCOUNTER — Other Ambulatory Visit: Payer: Self-pay

## 2017-05-13 ENCOUNTER — Encounter: Payer: Self-pay | Admitting: Emergency Medicine

## 2017-05-13 DIAGNOSIS — F17210 Nicotine dependence, cigarettes, uncomplicated: Secondary | ICD-10-CM | POA: Insufficient documentation

## 2017-05-13 DIAGNOSIS — M25562 Pain in left knee: Secondary | ICD-10-CM | POA: Insufficient documentation

## 2017-05-13 DIAGNOSIS — R1084 Generalized abdominal pain: Secondary | ICD-10-CM | POA: Insufficient documentation

## 2017-05-13 LAB — URINALYSIS, COMPLETE (UACMP) WITH MICROSCOPIC
Bacteria, UA: NONE SEEN
Bilirubin Urine: NEGATIVE
Glucose, UA: NEGATIVE mg/dL
Hgb urine dipstick: NEGATIVE
Ketones, ur: NEGATIVE mg/dL
Nitrite: NEGATIVE
Protein, ur: NEGATIVE mg/dL
SPECIFIC GRAVITY, URINE: 1.011 (ref 1.005–1.030)
pH: 5 (ref 5.0–8.0)

## 2017-05-13 LAB — BASIC METABOLIC PANEL
Anion gap: 7 (ref 5–15)
BUN: 12 mg/dL (ref 6–20)
CO2: 26 mmol/L (ref 22–32)
Calcium: 9 mg/dL (ref 8.9–10.3)
Chloride: 105 mmol/L (ref 101–111)
Creatinine, Ser: 0.89 mg/dL (ref 0.61–1.24)
GFR calc Af Amer: >60 mL/min (ref >60)
GFR calc non Af Amer: >60 mL/min (ref >60)
GLUCOSE: 93 mg/dL (ref 65–99)
Potassium: 4.5 mmol/L (ref 3.5–5.1)
Sodium: 138 mmol/L (ref 135–145)

## 2017-05-13 LAB — LIPASE, BLOOD: Lipase: 27 U/L (ref 11–51)

## 2017-05-13 LAB — CBC
HCT: 42.3 % (ref 40.0–52.0)
Hemoglobin: 14.6 g/dL (ref 13.0–18.0)
MCH: 29.6 pg (ref 26.0–34.0)
MCHC: 34.4 g/dL (ref 32.0–36.0)
MCV: 85.9 fL (ref 80.0–100.0)
Platelets: 288 10*3/uL (ref 150–440)
RBC: 4.92 MIL/uL (ref 4.40–5.90)
RDW: 13.3 % (ref 11.5–14.5)
WBC: 7.8 10*3/uL (ref 3.8–10.6)

## 2017-05-13 NOTE — ED Notes (Signed)
Patient observed holding brake handle and pushing wheelchair around and going out side.

## 2017-05-13 NOTE — ED Triage Notes (Signed)
Pt arrives ambulatory to triage with c/o right flank pain as well as an old injury to the left knee. Pt denies hx of kidney stones and reports that he woke up with this pain today. Pt is in NAD.

## 2017-05-14 ENCOUNTER — Emergency Department
Admission: EM | Admit: 2017-05-14 | Discharge: 2017-05-14 | Disposition: A | Discharge status: Home / Self Care | Payer: Self-pay | Attending: Emergency Medicine | Admitting: Emergency Medicine

## 2017-05-14 DIAGNOSIS — R109 Unspecified abdominal pain: Secondary | ICD-10-CM

## 2017-05-14 DIAGNOSIS — M25562 Pain in left knee: Secondary | ICD-10-CM

## 2017-05-14 DIAGNOSIS — G8929 Other chronic pain: Secondary | ICD-10-CM

## 2017-05-14 MED ORDER — SODIUM CHLORIDE 0.9 % IV BOLUS (SEPSIS)
1000.0000 mL | Freq: Once | INTRAVENOUS | Status: AC
  Administered 2017-05-14: 1000 mL via INTRAVENOUS

## 2017-05-14 MED ORDER — TRAMADOL HCL 50 MG PO TABS: 50.0000 mg | ORAL_TABLET | Freq: Four times a day (QID) | ORAL | 0 refills | Status: DC | PRN

## 2017-05-14 MED ORDER — KETOROLAC TROMETHAMINE 30 MG/ML IJ SOLN
30.0000 mg | Freq: Once | INTRAMUSCULAR | Status: AC
  Administered 2017-05-14: 30 mg via INTRAVENOUS
  Filled 2017-05-14: qty 1

## 2017-05-14 NOTE — Discharge Instructions (Signed)
Please follow-up with orthopedic surgery in the acute care clinic.  Please return with any other concerns.

## 2017-05-14 NOTE — ED Provider Notes (Signed)
[facility] Emergency Department Provider Note   ____________________________________________   First MD Initiated Contact with Patient 05/14/17 0032     (approximate)  I have reviewed the triage vital signs and the nursing notes.   HISTORY  Chief Complaint Flank Pain    HPI Nathaniel Freeman is a 37 y.o. male who comes into the hospital today with some right side pain.  He also states that his left knee popped out and he had some swelling in his left ankle.  The patient states that he is falling apart.  He reports that he is in severe pain but did not take anything for his symptoms today.  The patient denies trauma.  He rates his pain 8 out of 10 in intensity.  He said that he did vomit once here but he was not sure if it was because there was so many sick people in the waiting room.  The patient states that he had a partial tear to his left meniscus about a year ago but he is not had it seen or evaluated.  He assumed it would repair on its own.  The patient denies any ice to his near to his ankle today but reports that it looked a little swollen.  He reports now that it is not much more swollen than normal.  The patient was mainly concerned about this pain on his side.  He was unsure if it was due to his kidneys.  He is here today for evaluation.  History reviewed. No pertinent past medical history.  There are no active problems to display for this patient.   History reviewed. No pertinent surgical history.  Prior to Admission medications   Medication Sig Start Date End Date Taking? Authorizing Provider  traMADol (ULTRAM) 50 MG tablet Take 1 tablet (50 mg total) by mouth every 6 (six) hours as needed. 05/14/17   Rebecka Apley, MD    Allergies Patient has no known allergies.  No family history on file.  Social History Social History   Tobacco Use  . Smoking status: Current Every Day Smoker    Packs/day: 0.20    Types: Cigarettes  . Smokeless  tobacco: Never Used  Substance Use Topics  . Alcohol use: Yes    Comment: occasional  . Drug use: Yes    Types: Marijuana    Comment: last smoked in the last 24 hours    Review of Systems  Constitutional: No fever/chills Eyes: No visual changes. ENT: No sore throat. Cardiovascular: Denies chest pain. Respiratory: Denies shortness of breath. Gastrointestinal: right side, vomiting.  No diarrhea.  No constipation. Genitourinary: Negative for dysuria. Musculoskeletal: right flank pain, left knee and ankle pain Skin: Negative for rash. Neurological: Negative for headaches, focal weakness or numbness.   ____________________________________________   PHYSICAL EXAM:  VITAL SIGNS: ED Triage Vitals  Enc Vitals Group     BP 05/13/17 1912 (!) 120/92     Pulse Rate 05/13/17 1912 (!) 58     Resp 05/13/17 1912 18     Temp 05/13/17 1912 98.7 F (37.1 C)     Temp Source 05/13/17 1912 Oral     SpO2 05/13/17 1912 100 %     Weight 05/13/17 1913 190 lb (86.2 kg)     Height 05/13/17 1913 6\' 1"  (1.854 m)     Head Circumference --      Peak Flow --      Pain Score 05/13/17 1913 9     Pain  Loc --      Pain Edu? --      Excl. in GC? --     Constitutional: Alert and oriented. Well appearing and in moderate distress. Eyes: Conjunctivae are normal. PERRL. EOMI. Head: Atraumatic. Nose: No congestion/rhinnorhea. Mouth/Throat: Mucous membranes are moist.  Oropharynx non-erythematous. Cardiovascular: Normal rate, regular rhythm. Grossly normal heart sounds.  Good peripheral circulation. Respiratory: Normal respiratory effort.  No retractions. Lungs CTAB. Gastrointestinal: Soft with some right side tenderness to palpation, no right upper quadrant tenderness to palpation. No distention. Positive bowel sounds, mild right CVA tenderness to palpation Musculoskeletal: No lower extremity tenderness nor edema.   Neurologic:  Normal speech and language.  Skin:  Skin is warm, dry and intact.    Psychiatric: Mood and affect are normal.   ____________________________________________   LABS (all labs ordered are listed, but only abnormal results are displayed)  Labs Reviewed  URINALYSIS, COMPLETE (UACMP) WITH MICROSCOPIC - Abnormal; Notable for the following components:      Result Value   Color, Urine YELLOW (*)    APPearance CLEAR (*)    Leukocytes, UA TRACE (*)    Squamous Epithelial / LPF 0-5 (*)    All other components within normal limits  BASIC METABOLIC PANEL  CBC  LIPASE, BLOOD   ____________________________________________  EKG  none ____________________________________________  RADIOLOGY  ED MD interpretation:  CT renal stone protocol: Negative for hydronephrosis or ureteral stone, thick-walled appearance of the bladder however bladder nearly empty  Left knee x-ray, no osseous fracture or dislocation, questionable joint effusion  Official radiology report(s): Dg Knee Complete 4 Views Left  Result Date: 05/13/2017 CLINICAL DATA:  Twisting injury today, pain. EXAM: LEFT KNEE - COMPLETE 4+ VIEW COMPARISON:  None. FINDINGS: Osseous alignment is normal. No fracture line or displaced fracture fragment. Questionable joint effusion within the suprapatellar bursa, but not convincing. IMPRESSION: 1. No osseous fracture or dislocation. 2. Questionable joint effusion. Electronically Signed   By: Bary Richard M.D.   On: 05/13/2017 19:52   Ct Renal Stone Study  Result Date: 05/13/2017 CLINICAL DATA:  Right flank pain EXAM: CT ABDOMEN AND PELVIS WITHOUT CONTRAST TECHNIQUE: Multidetector CT imaging of the abdomen and pelvis was performed following the standard protocol without IV contrast. COMPARISON:  09/30/2014 CT FINDINGS: Lower chest: No acute abnormality. Hepatobiliary: No focal liver abnormality is seen. No gallstones, gallbladder wall thickening, or biliary dilatation. Pancreas: Unremarkable. No pancreatic ductal dilatation or surrounding inflammatory changes. Spleen:  Normal in size without focal abnormality. Adrenals/Urinary Tract: Adrenal glands are unremarkable. Kidneys are normal, without renal calculi, focal lesion, or hydronephrosis. Bladder is thick walled but nearly empty. Stomach/Bowel: Stomach is within normal limits. Appendix appears normal. No evidence of bowel wall thickening, distention, or inflammatory changes. Vascular/Lymphatic: No significant vascular findings are present. No enlarged abdominal or pelvic lymph nodes. Reproductive: Prominent size of prostate gland. Other: Negative for free air or free fluid. Small fat in the right inguinal region. Musculoskeletal: Cyst in the right superior acetabulum. No acute osseous abnormality. IMPRESSION: 1. Negative for hydronephrosis or ureteral stone. Thick-walled appearance of the bladder however bladder nearly empty. Could correlate with urinalysis to exclude cystitis. 2. No CT evidence for acute intra-abdominal or pelvic abnormality. Electronically Signed   By: Jasmine Pang M.D.   On: 05/13/2017 19:37    ____________________________________________   PROCEDURES  Procedure(s) performed: None  Procedures  Critical Care performed: No  ____________________________________________   INITIAL IMPRESSION / ASSESSMENT AND PLAN / ED COURSE  As part of my  medical decision making, I reviewed the following data within the electronic MEDICAL RECORD NUMBER Notes from prior ED visits and [place] Controlled Substance Database   This is a 37 year old male who comes into the hospital today with some right side pain.  My differential diagnosis includes kidney stone, urinary tract infection, musculoskeletal pain, biliary colic.   The patient had some blood work which is unremarkable.  We also checked the patient's urine which was negative.  We did check a CT scan looking for kidney stones in the patient CT scan was negative.  I did give the patient a liter of normal saline and a shot of Toradol.  He will be discharged  home and encouraged to follow-up with the acute care clinic.  I also encouraged the patient to follow-up with orthopedic surgery for further evaluation of his knee.  Patient should return with any worsening pain or any other concerns.  He may be developing some gastroenteritis given the fact that he did vomit he states in the waiting room.          ____________________________________________   FINAL CLINICAL IMPRESSION(S) / ED DIAGNOSES  Final diagnoses:  Flank pain  Chronic pain of left knee     ED Discharge Orders        Ordered    traMADol (ULTRAM) 50 MG tablet  Every 6 hours PRN     05/14/17 0145       Note:  This document was prepared using Dragon voice recognition software and may include unintentional dictation errors.    Rebecka ApleyWebster, Tarsha Blando P, MD 05/14/17 [phone]

## 2017-05-14 NOTE — ED Notes (Signed)
Pt up to stat desk, being pushed by visitor, to ask again about wait time; explained again the wait time, that he's been here the longest, and understands there is one patient that needs to go before him at this time;

## 2017-05-14 NOTE — ED Notes (Signed)
Pt squeezed brake release on the back of his wheelchair and pushed himself with one foot up to stat registration to ask about the wait time; understands there is someone that has been here longer than him and that we go by time when allowed; understands there may be patients that will medically need to go before him;

## 2017-07-19 ENCOUNTER — Other Ambulatory Visit: Payer: Self-pay

## 2017-07-19 ENCOUNTER — Emergency Department
Admission: EM | Admit: 2017-07-19 | Discharge: 2017-07-19 | Disposition: A | Discharge status: Home / Self Care | Payer: Self-pay | Attending: Emergency Medicine | Admitting: Emergency Medicine

## 2017-07-19 DIAGNOSIS — Y999 Unspecified external cause status: Secondary | ICD-10-CM | POA: Insufficient documentation

## 2017-07-19 DIAGNOSIS — W25XXXA Contact with sharp glass, initial encounter: Secondary | ICD-10-CM | POA: Insufficient documentation

## 2017-07-19 DIAGNOSIS — F17210 Nicotine dependence, cigarettes, uncomplicated: Secondary | ICD-10-CM | POA: Insufficient documentation

## 2017-07-19 DIAGNOSIS — Y9389 Activity, other specified: Secondary | ICD-10-CM | POA: Insufficient documentation

## 2017-07-19 DIAGNOSIS — S61012A Laceration without foreign body of left thumb without damage to nail, initial encounter: Secondary | ICD-10-CM | POA: Insufficient documentation

## 2017-07-19 DIAGNOSIS — Y929 Unspecified place or not applicable: Secondary | ICD-10-CM | POA: Insufficient documentation

## 2017-07-19 MED ORDER — LIDOCAINE HCL (PF) 1 % IJ SOLN
5.0000 mL | Freq: Once | INTRAMUSCULAR | Status: DC
  Filled 2017-07-19: qty 5

## 2017-07-19 NOTE — ED Notes (Signed)
Pt ambulatory from triage.  Pt states he cut hand with glass that he removed from his shoe.  Pt A&Ox4, in NAD.  Pt asking for remote at this time.

## 2017-07-19 NOTE — Discharge Instructions (Addendum)
Keep the wounds clean, dry, and covered. Wash only with soap & water as needed.

## 2017-07-19 NOTE — ED Triage Notes (Addendum)
Pt arrives to ED via POV with c/o laceration to the LEFT thumb. Pt reports cutting in on a piece of glass that he removed from the sole of his shoe just PTA. Pt arrives with laceration wrapped in gauze, bleeding stopped at this time.

## 2017-07-21 NOTE — ED Provider Notes (Signed)
[facility] Emergency Department Provider Note ____________________________________________  Time seen: 2143  I have reviewed the triage vital signs and the nursing notes.  HISTORY  Chief Complaint  Finger Injury and Laceration  HPI Nathaniel Freeman is a 37 y.o. male presents himself to the ED for evaluation of a laceration to his left thumb.  Patient describes accidentally cut his thumb on a piece of glass he was attempted to remove and the sole of issue.  He presents with gauze in place and no active bleeding noted at the time.  He reports a current tetanus status.  History reviewed. No pertinent past medical history.  There are no active problems to display for this patient.  History reviewed. No pertinent surgical history.  Prior to Admission medications   Medication Sig Start Date End Date Taking? Authorizing Provider  traMADol (ULTRAM) 50 MG tablet Take 1 tablet (50 mg total) by mouth every 6 (six) hours as needed. 05/14/17   Rebecka Apley, MD    Allergies Patient has no known allergies.  No family history on file.  Social History Social History   Tobacco Use  . Smoking status: Current Every Day Smoker    Packs/day: 0.20    Types: Cigarettes  . Smokeless tobacco: Never Used  Substance Use Topics  . Alcohol use: Yes    Comment: occasional  . Drug use: Yes    Types: Marijuana    Comment: last smoked in the last 24 hours    Review of Systems  Constitutional: Negative for fever. Cardiovascular: Negative for chest pain. Respiratory: Negative for shortness of breath. Musculoskeletal: Negative for back pain. Skin: Negative for rash.  Left thumb laceration as above. Neurological: Negative for headaches, focal weakness or numbness. ____________________________________________  PHYSICAL EXAM:  VITAL SIGNS: ED Triage Vitals  Enc Vitals Group     BP 07/19/17 2058 (!) 146/98     Pulse Rate 07/19/17 2058 83     Resp 07/19/17 2058 17      Temp 07/19/17 2058 98.9 F (37.2 C)     Temp Source 07/19/17 2058 Oral     SpO2 07/19/17 2058 95 %     Weight 07/19/17 2059 192 lb (87.1 kg)     Height 07/19/17 2059  (1.854 m)     Head Circumference --      Peak Flow --      Pain Score 07/19/17 2104 10     Pain Loc --      Pain Edu? --      Excl. in GC? --     Constitutional: Alert and oriented. Well appearing and in no distress. Head: Normocephalic and atraumatic. Cardiovascular: Normal rate, regular rhythm. Normal distal pulses. Respiratory: Normal respiratory effort. No wheezes/rales/rhonchi. Musculoskeletal: Composite fist on the left.  Left thumb with a large flap laceration across the fat pad.  Nontender with normal range of motion in all extremities.  Neurologic:  Normal gross sensation. Normal speech and language. No gross focal neurologic deficits are appreciated. Skin:  Skin is warm, dry and intact. No rash noted. Psychiatric: Mood and affect are normal. Patient exhibits appropriate insight and judgment. ____________________________________________  PROCEDURES  .Marland KitchenLaceration Repair Date/Time: 07/21/2017 12:33 AM Performed by: Lissa Hoard, PA-C Authorized by: Lissa Hoard, PA-C   Consent:    Consent obtained:  Verbal   Consent given by:  Patient   Risks discussed:  Poor wound healing and pain Anesthesia (see MAR for exact dosages):  Anesthesia method:  Local infiltration and nerve block   Local anesthetic:  Lidocaine 1% w/o epi   Block location:  Transthecal   Block needle gauge:  27 G   Block anesthetic:  Lidocaine 1% w/o epi   Block injection procedure:  Anatomic landmarks palpated   Block outcome:  Incomplete block Laceration details:    Location:  Finger   Finger location:  L thumb   Length (cm):  3 Repair type:    Repair type:  Simple Pre-procedure details:    Preparation:  Patient was prepped and draped in usual sterile fashion Treatment:    Area cleansed with:   Betadine   Amount of cleaning:  Standard   Irrigation solution:  Sterile saline   Irrigation method:  Syringe Skin repair:    Repair method:  Sutures   Suture size:  4-0   Suture material:  Nylon   Suture technique:  Simple interrupted   Number of sutures:  8 Approximation:    Approximation:  Close Post-procedure details:    Dressing:  Non-adherent dressing   Patient tolerance of procedure:  Tolerated well, no immediate complications  ____________________________________________  INITIAL IMPRESSION / ASSESSMENT AND PLAN / ED COURSE  Patient with ED evaluation management of a left thumb laceration.  Wound is cleansed, prepped, draped in the usual fashion and wound repair is achieved using sutures.  Patient is given wound care instructions and the wound is dressed as appropriate.  He will follow-up with his primary provider or local care for suture removal in 10 to 12 days. ____________________________________________  FINAL CLINICAL IMPRESSION(S) / ED DIAGNOSES  Final diagnoses:  Laceration of left thumb without foreign body without damage to nail, initial encounter      Lissa Hoard, PA-C 07/21/17 0036    Sharman Cheek, MD 07/22/17 [phone]

## 2017-11-30 ENCOUNTER — Emergency Department
Admission: EM | Admit: 2017-11-30 | Discharge: 2017-11-30 | Disposition: A | Discharge status: Home / Self Care | Payer: Self-pay | Attending: Emergency Medicine | Admitting: Emergency Medicine

## 2017-11-30 ENCOUNTER — Encounter: Payer: Self-pay | Admitting: Emergency Medicine

## 2017-11-30 ENCOUNTER — Other Ambulatory Visit: Payer: Self-pay

## 2017-11-30 DIAGNOSIS — Y998 Other external cause status: Secondary | ICD-10-CM | POA: Insufficient documentation

## 2017-11-30 DIAGNOSIS — S61209A Unspecified open wound of unspecified finger without damage to nail, initial encounter: Secondary | ICD-10-CM | POA: Insufficient documentation

## 2017-11-30 DIAGNOSIS — W268XXA Contact with other sharp object(s), not elsewhere classified, initial encounter: Secondary | ICD-10-CM | POA: Insufficient documentation

## 2017-11-30 DIAGNOSIS — Y929 Unspecified place or not applicable: Secondary | ICD-10-CM | POA: Insufficient documentation

## 2017-11-30 DIAGNOSIS — Y939 Activity, unspecified: Secondary | ICD-10-CM | POA: Insufficient documentation

## 2017-11-30 DIAGNOSIS — F1210 Cannabis abuse, uncomplicated: Secondary | ICD-10-CM | POA: Insufficient documentation

## 2017-11-30 DIAGNOSIS — F17210 Nicotine dependence, cigarettes, uncomplicated: Secondary | ICD-10-CM | POA: Insufficient documentation

## 2017-11-30 MED ORDER — NEOMYCIN-POLYMYXIN-PRAMOXINE 1 % EX CREA: TOPICAL_CREAM | Freq: Two times a day (BID) | CUTANEOUS | 0 refills | Status: DC

## 2017-11-30 MED ORDER — LIDOCAINE-EPINEPHRINE-TETRACAINE (LET) SOLUTION
NASAL | Status: AC
  Filled 2017-11-30: qty 3

## 2017-11-30 MED ORDER — LIDOCAINE HCL (PF) 1 % IJ SOLN: 5.0000 mL | Freq: Once | INTRAMUSCULAR | Status: DC

## 2017-11-30 MED ORDER — TRAMADOL HCL 50 MG PO TABS: 50.0000 mg | ORAL_TABLET | Freq: Two times a day (BID) | ORAL | 0 refills | Status: DC | PRN

## 2017-11-30 NOTE — ED Notes (Signed)
Left thumb with lac. Bleeding controlled with wrap.

## 2017-11-30 NOTE — ED Triage Notes (Signed)
Patient with laceration to left thumb. Wrapped in washcloth, bleeding controlled. Patient states he was cutting with a box cutter with he cut his thumb. Was at work but unable to give name of company or any supervisor information.

## 2017-11-30 NOTE — ED Provider Notes (Signed)
[facility] Emergency Department Provider Note    ____________________________________________   First MD Initiated Contact with Patient 11/30/17 1159     (approximate)  I have reviewed the triage vital signs and the nursing notes.   HISTORY  Chief Complaint Laceration    HPI Nathaniel Freeman [place] is a 37 y.o. male patient presents with laceration to the left palmar aspect of the thumb.  Patient was using a box cutter the slip causing the injury.  Patient denies loss of sensation or loss of function.  Patient state bleeding is controlled by direct pressure.  Patient rates his pain discomfort as a 4/10.  Patient described pain is "sore".  Patient is right-hand dominant.  History reviewed. No pertinent past medical history.  There are no active problems to display for this patient.   History reviewed. No pertinent surgical history.  Prior to Admission medications   Medication Sig Start Date End Date Taking? Authorizing Provider  neomycin-polymyxin-pramoxine (NEOSPORIN PLUS) 1 % cream Apply topically 2 (two) times daily. 11/30/17   Joni Reining, PA-C  traMADol (ULTRAM) 50 MG tablet Take 1 tablet (50 mg total) by mouth every 6 (six) hours as needed. 05/14/17   Rebecka Apley, MD  traMADol (ULTRAM) 50 MG tablet Take 1 tablet (50 mg total) by mouth every 12 (twelve) hours as needed. 11/30/17   Joni Reining, PA-C    Allergies Patient has no known allergies.  No family history on file.  Social History Social History   Tobacco Use  . Smoking status: Current Every Day Smoker    Packs/day: 0.20    Types: Cigarettes  . Smokeless tobacco: Never Used  Substance Use Topics  . Alcohol use: Yes    Comment: occasional  . Drug use: Yes    Types: Marijuana    Comment: last smoked in the last 24 hours    Review of Systems Constitutional: No fever/chills Eyes: No visual changes. ENT: No sore throat. Cardiovascular: Denies chest pain. Respiratory:  Denies shortness of breath. Gastrointestinal: No abdominal pain.  No nausea, no vomiting.  No diarrhea.  No constipation. Genitourinary: Negative for dysuria. Musculoskeletal: Negative for back pain. Skin: Negative for rash.  Laceration left thumb Neurological: Negative for headaches, focal weakness or numbness.   ____________________________________________   PHYSICAL EXAM:  VITAL SIGNS: ED Triage Vitals  Enc Vitals Group     BP 11/30/17 1102 (!) 144/96     Pulse Rate 11/30/17 1102 83     Resp 11/30/17 1102 16     Temp 11/30/17 1102 98.1 F (36.7 C)     Temp Source 11/30/17 1102 Oral     SpO2 11/30/17 1102 99 %     Weight 11/30/17 1104 185 lb (83.9 kg)     Height 11/30/17 1104 6' (1.829 m)     Head Circumference --      Peak Flow --      Pain Score 11/30/17 1103 4     Pain Loc --      Pain Edu? --      Excl. in GC? --    Constitutional: Alert and oriented. Well appearing and in no acute distress. Cardiovascular: Normal rate, regular rhythm. Grossly normal heart sounds.  Good peripheral circulation. Respiratory: Normal respiratory effort.  No retractions. Lungs CTAB. Musculoskeletal: No lower extremity tenderness nor edema.  No joint effusions. Neurologic:  Normal speech and language. No gross focal neurologic deficits are appreciated. No gait instability. Skin:  Skin is warm, dry and  intact. No rash noted.  Skin avulsion to the palmar aspect of the left thumb. Psychiatric: Mood and affect are normal. Speech and behavior are normal.  ____________________________________________   LABS (all labs ordered are listed, but only abnormal results are displayed)  Labs Reviewed - No data to display ____________________________________________  EKG   ____________________________________________  RADIOLOGY  ED MD interpretation:    Official radiology report(s): No results found.  ____________________________________________   PROCEDURES  Procedure(s) performed:  None  Procedures  Critical Care performed: No  ____________________________________________   INITIAL IMPRESSION / ASSESSMENT AND PLAN / ED COURSE  As part of my medical decision making, I reviewed the following data within the electronic MEDICAL RECORD NUMBER    Bleeding secondary to a skin avulsion left thumb.  Advised patient wound will have to heal by granulation.  Patient given discharge care instruction work note.  Patient [facility].      ____________________________________________   FINAL CLINICAL IMPRESSION(S) / ED DIAGNOSES  Final diagnoses:  Avulsion of skin of finger, initial encounter     ED Discharge Orders         Ordered    neomycin-polymyxin-pramoxine (NEOSPORIN PLUS) 1 % cream  2 times daily     11/30/17 1255    traMADol (ULTRAM) 50 MG tablet  Every 12 hours PRN     11/30/17 1255           Note:  This document was prepared using Dragon voice recognition software and may include unintentional dictation errors.    Joni Reining, PA-C 11/30/17 1258    Emily Filbert, MD 11/30/17 [phone]

## 2017-11-30 NOTE — Discharge Instructions (Signed)
Follow discharge care instructions apply ointment as directed. °

## 2018-04-16 LAB — HM HIV SCREENING LAB: HM HIV Screening: NEGATIVE

## 2018-04-16 LAB — HM HEPATITIS C SCREENING LAB: HM Hepatitis Screen: NEGATIVE

## 2018-04-16 LAB — HEPATITIS B SURFACE ANTIGEN: Hepatitis B Surface Ag: NONREACTIVE

## 2018-09-08 ENCOUNTER — Encounter (HOSPITAL_COMMUNITY): Payer: Self-pay | Admitting: Emergency Medicine

## 2018-09-08 ENCOUNTER — Other Ambulatory Visit: Payer: Self-pay

## 2018-09-08 ENCOUNTER — Emergency Department (HOSPITAL_COMMUNITY)
Admission: EM | Admit: 2018-09-08 | Discharge: 2018-09-08 | Disposition: A | Discharge status: Home / Self Care | Payer: Self-pay | Attending: Emergency Medicine | Admitting: Emergency Medicine

## 2018-09-08 DIAGNOSIS — R202 Paresthesia of skin: Secondary | ICD-10-CM | POA: Insufficient documentation

## 2018-09-08 DIAGNOSIS — Z5321 Procedure and treatment not carried out due to patient leaving prior to being seen by health care provider: Secondary | ICD-10-CM | POA: Insufficient documentation

## 2018-09-08 NOTE — ED Triage Notes (Signed)
C/o intermittent R arm numbness since 2pm Friday.  No neuro deficits noted on triage exam.  No arm drift.  Reports high blood pressure and also c/o L knee pain x 1 month.

## 2018-09-08 NOTE — ED Notes (Signed)
Pt straight to room on arrival.  Ericson back out front and states he doesn't want to stay.

## 2019-02-09 ENCOUNTER — Emergency Department
Admission: EM | Admit: 2019-02-09 | Discharge: 2019-02-10 | Disposition: A | Discharge status: Home / Self Care | Payer: Self-pay | Attending: Emergency Medicine | Admitting: Emergency Medicine

## 2019-02-09 ENCOUNTER — Other Ambulatory Visit: Payer: Self-pay

## 2019-02-09 ENCOUNTER — Emergency Department: Payer: Self-pay

## 2019-02-09 ENCOUNTER — Encounter: Payer: Self-pay | Admitting: Emergency Medicine

## 2019-02-09 DIAGNOSIS — B349 Viral infection, unspecified: Secondary | ICD-10-CM | POA: Insufficient documentation

## 2019-02-09 DIAGNOSIS — F1210 Cannabis abuse, uncomplicated: Secondary | ICD-10-CM | POA: Insufficient documentation

## 2019-02-09 DIAGNOSIS — Z20828 Contact with and (suspected) exposure to other viral communicable diseases: Secondary | ICD-10-CM | POA: Insufficient documentation

## 2019-02-09 DIAGNOSIS — F17210 Nicotine dependence, cigarettes, uncomplicated: Secondary | ICD-10-CM | POA: Insufficient documentation

## 2019-02-09 DIAGNOSIS — Z20822 Contact with and (suspected) exposure to covid-19: Secondary | ICD-10-CM

## 2019-02-09 LAB — CBC
HCT: 43.6 % (ref 39.0–52.0)
Hemoglobin: 15 g/dL (ref 13.0–17.0)
MCH: 29.2 pg (ref 26.0–34.0)
MCHC: 34.4 g/dL (ref 30.0–36.0)
MCV: 84.8 fL (ref 80.0–100.0)
Platelets: 353 10*3/uL (ref 150–400)
RBC: 5.14 MIL/uL (ref 4.22–5.81)
RDW: 13.4 % (ref 11.5–15.5)
WBC: 12.1 10*3/uL — ABNORMAL HIGH (ref 4.0–10.5)
nRBC: 0 % (ref 0.0–0.2)

## 2019-02-09 LAB — COMPREHENSIVE METABOLIC PANEL
ALT: 29 U/L (ref 0–44)
AST: 57 U/L — ABNORMAL HIGH (ref 15–41)
Albumin: 4.4 g/dL (ref 3.5–5.0)
Alkaline Phosphatase: 69 U/L (ref 38–126)
Anion gap: 16 — ABNORMAL HIGH (ref 5–15)
BUN: 11 mg/dL (ref 6–20)
CO2: 21 mmol/L — ABNORMAL LOW (ref 22–32)
Calcium: 9 mg/dL (ref 8.9–10.3)
Chloride: 97 mmol/L — ABNORMAL LOW (ref 98–111)
Creatinine, Ser: 0.93 mg/dL (ref 0.61–1.24)
GFR calc Af Amer: >60 mL/min (ref >60)
GFR calc non Af Amer: >60 mL/min (ref >60)
Glucose, Bld: 87 mg/dL (ref 70–99)
Potassium: 3.9 mmol/L (ref 3.5–5.1)
Sodium: 134 mmol/L — ABNORMAL LOW (ref 135–145)
Total Bilirubin: 1.1 mg/dL (ref 0.3–1.2)
Total Protein: 8.3 g/dL — ABNORMAL HIGH (ref 6.5–8.1)

## 2019-02-09 LAB — URINALYSIS, COMPLETE (UACMP) WITH MICROSCOPIC
Bacteria, UA: NONE SEEN
Bilirubin Urine: NEGATIVE
Glucose, UA: NEGATIVE mg/dL
Ketones, ur: 5 mg/dL — AB
Leukocytes,Ua: NEGATIVE
Nitrite: NEGATIVE
Protein, ur: 30 mg/dL — AB
Specific Gravity, Urine: 1.023 (ref 1.005–1.030)
pH: 5 (ref 5.0–8.0)

## 2019-02-09 LAB — LIPASE, BLOOD: Lipase: 25 U/L (ref 11–51)

## 2019-02-09 MED ORDER — FAMOTIDINE IN NACL 20-0.9 MG/50ML-% IV SOLN
20.0000 mg | Freq: Once | INTRAVENOUS | Status: AC
  Administered 2019-02-10: 01:00:00 20 mg via INTRAVENOUS
  Filled 2019-02-09: qty 50

## 2019-02-09 MED ORDER — SODIUM CHLORIDE 0.9% FLUSH: 3.0000 mL | Freq: Once | INTRAVENOUS | Status: DC

## 2019-02-09 MED ORDER — SODIUM CHLORIDE 0.9 % IV BOLUS
1000.0000 mL | Freq: Once | INTRAVENOUS | Status: AC
  Administered 2019-02-10: 1000 mL via INTRAVENOUS

## 2019-02-09 MED ORDER — ONDANSETRON HCL 4 MG/2ML IJ SOLN
4.0000 mg | Freq: Once | INTRAMUSCULAR | Status: AC
  Administered 2019-02-10: 01:00:00 4 mg via INTRAVENOUS
  Filled 2019-02-09: qty 2

## 2019-02-09 NOTE — ED Provider Notes (Signed)
[facility] Emergency Department Provider Note  ____________________________________________  Time seen: Approximately 11:45 PM  I have reviewed the triage vital signs and the nursing notes.   HISTORY  Chief Complaint Emesis   HPI Nathaniel Freeman is a 38 y.o. male who presents for evaluation of Covid-like symptoms.  Patient reports 2 days of sore throat, mild cough, vomiting and diarrhea.  Reports subjective fevers earlier today and chills.  He has had several episodes of watery diarrhea today that he describes as really dark.  Has had a couple episodes of emesis which she describes as black slush. Patient drinks 12 beers a day. Denis NSAID use. Denies current abdominal pain. Had some abdominal cramping earlier today. No known exposure to COVID. No CP or SOB.  PMH None - reviewed  Prior to Admission medications   Medication Sig Start Date End Date Taking? Authorizing Provider  acetaminophen (TYLENOL) 500 MG tablet Take 2 tablets (1,000 mg total) by mouth every 8 (eight) hours as needed for mild pain, moderate pain, fever or headache. 02/10/19 02/10/20  Rudene Re, MD  neomycin-polymyxin-pramoxine (NEOSPORIN PLUS) 1 % cream Apply topically 2 (two) times daily. 11/30/17   Sable Feil, PA-C  ondansetron (ZOFRAN ODT) 4 MG disintegrating tablet Take 1 tablet (4 mg total) by mouth every 8 (eight) hours as needed. 02/10/19   Rudene Re, MD  traMADol (ULTRAM) 50 MG tablet Take 1 tablet (50 mg total) by mouth every 6 (six) hours as needed. 05/14/17   Loney Hering, MD  traMADol (ULTRAM) 50 MG tablet Take 1 tablet (50 mg total) by mouth every 12 (twelve) hours as needed. 11/30/17   Sable Feil, PA-C    Allergies Patient has no known allergies.  History reviewed. No pertinent family history.  Social History Social History   Tobacco Use  . Smoking status: Current Every Day Smoker    Packs/day: 0.20    Types: Cigarettes  . Smokeless tobacco:  Never Used  Substance Use Topics  . Alcohol use: Yes    Comment: occasional  . Drug use: Yes    Types: Marijuana    Comment: last smoked in the last 24 hours    Review of Systems  Constitutional: + subjective fever and body aches Eyes: Negative for visual changes. ENT: + sore throat. Neck: No neck pain  Cardiovascular: Negative for chest pain. Respiratory: Negative for shortness of breath. + cough Gastrointestinal: + abdominal pain, vomiting and diarrhea. Genitourinary: Negative for dysuria. Musculoskeletal: Negative for back pain. Skin: Negative for rash. Neurological: Negative for headaches, weakness or numbness. Psych: No SI or HI  ____________________________________________   PHYSICAL EXAM:  VITAL SIGNS: ED Triage Vitals  Enc Vitals Group     BP 02/09/19 2046 (!) 155/104     Pulse Rate 02/09/19 2046 (!) 104     Resp 02/09/19 2046 18     Temp 02/09/19 2046 98.9 F (37.2 C)     Temp Source 02/09/19 2046 Oral     SpO2 02/09/19 2046 95 %     Weight 02/09/19 2047 176 lb (79.8 kg)     Height 02/09/19 2047 6' (1.829 m)     Head Circumference --      Peak Flow --      Pain Score 02/09/19 2046 10     Pain Loc --      Pain Edu? --      Excl. in [place]? --     Constitutional: Alert and oriented. Well appearing and  in no apparent distress. HEENT:      Head: Normocephalic and atraumatic.         Eyes: Conjunctivae are normal. Sclera is non-icteric.       Mouth/Throat: Mucous membranes are moist.       Neck: Supple with no signs of meningismus. Cardiovascular: Regular rate and rhythm. No murmurs, gallops, or rubs. 2+ symmetrical distal pulses are present in all extremities. No JVD. Respiratory: Normal respiratory effort. Lungs are clear to auscultation bilaterally. No wheezes, crackles, or rhonchi.  Gastrointestinal: Soft, non tender, and non distended with positive bowel sounds. No rebound or guarding. Genitourinary: No CVA tenderness. Rectal showing yellow stool guaiac  negative Musculoskeletal: Nontender with normal range of motion in all extremities. No edema, cyanosis, or erythema of extremities. Neurologic: Normal speech and language. Face is symmetric. Moving all extremities. No gross focal neurologic deficits are appreciated. Skin: Skin is warm, dry and intact. No rash noted. Psychiatric: Mood and affect are normal. Speech and behavior are normal.  ____________________________________________   LABS (all labs ordered are listed, but only abnormal results are displayed)  Labs Reviewed  COMPREHENSIVE METABOLIC PANEL - Abnormal; Notable for the following components:      Result Value   Sodium 134 (*)    Chloride 97 (*)    CO2 21 (*)    Total Protein 8.3 (*)    AST 57 (*)    Anion gap 16 (*)    All other components within normal limits  CBC - Abnormal; Notable for the following components:   WBC 12.1 (*)    All other components within normal limits  URINALYSIS, COMPLETE (UACMP) WITH MICROSCOPIC - Abnormal; Notable for the following components:   Color, Urine YELLOW (*)    APPearance CLEAR (*)    Hgb urine dipstick SMALL (*)    Ketones, ur 5 (*)    Protein, ur 30 (*)    All other components within normal limits  NOVEL CORONAVIRUS, NAA (HOSP ORDER, SEND-OUT TO REF LAB; TAT 18-24 HRS)  LIPASE, BLOOD   ____________________________________________  EKG   ED ECG REPORT I, Nita Sickle, the attending physician, personally viewed and interpreted this ECG.  Normal sinus rhythm, rate of 98, normal intervals, normal axis, no ST elevations or depressions, anterior Q waves.  No prior for comparison. ____________________________________________  RADIOLOGY  I have personally reviewed the images performed during this visit and I agree with the Radiologist's read.   Interpretation by Radiologist:  Dg Chest Portable 1 View  Result Date: 02/09/2019 CLINICAL DATA:  Cough. EXAM: PORTABLE CHEST 1 VIEW COMPARISON:  None. FINDINGS: Bilateral  nipple shadows are noted. The heart, hila, and mediastinum are normal. No pneumothorax. No suspicious nodules, masses, or infiltrates. IMPRESSION: No active disease. Electronically Signed   By: Gerome Sam III M.D   On: 02/09/2019 23:56     ____________________________________________   PROCEDURES  Procedure(s) performed: None Procedures Critical Care performed:  None ____________________________________________   INITIAL IMPRESSION / ASSESSMENT AND PLAN / ED COURSE  38 y.o. male who presents for evaluation of Covid-like symptoms x 2 days.  Patient extremely well-appearing in no distress with normal vital signs, normal work of breathing, normal sats both at rest and with ambulation.  Rectal exam showing no signs of melena or active GIB.Patient is HD stable with normal hgb of 15. Mild leukocytosis consistent with viral illness. COVID swab send. Discussed quarantine until results of Covid and quarantine if Covid is positive including monitoring of oxygenation with a pulse oximeter  at home.  Chest x-ray does not show pneumonia or infiltrate.  Labs showing mild anion gap in the setting of mild dehydration and chronic alcohol abuse.  Patient given IV fluids, Pepcid and Zofran.  No further episodes of vomiting or diarrhea in the emergency room.  Will p.o. challenge and reevaluate for disposition.       As part of my medical decision making, I reviewed the following data within the electronic MEDICAL RECORD NUMBER Nursing notes reviewed and incorporated, Labs reviewed , EKG interpreted , Old chart reviewed, Radiograph reviewed , Notes from prior ED visits and [place] Controlled Substance Database   Please note:  Patient was evaluated in Emergency Department today for the symptoms described in the history of present illness. Patient was evaluated in the context of the global COVID-19 pandemic, which necessitated consideration that the patient might be at risk for infection with the SARS-CoV-2 virus that  causes COVID-19. Institutional protocols and algorithms that pertain to the evaluation of patients at risk for COVID-19 are in a state of rapid change based on information released by regulatory bodies including the CDC and federal and state organizations. These policies and algorithms were followed during the patient's care in the ED.  Some ED evaluations and interventions may be delayed as a result of limited staffing during the pandemic.   ____________________________________________   FINAL CLINICAL IMPRESSION(S) / ED DIAGNOSES   Final diagnoses:  Viral illness  Suspected COVID-19 virus infection      NEW MEDICATIONS STARTED DURING THIS VISIT:  ED Discharge Orders         Ordered    ondansetron (ZOFRAN ODT) 4 MG disintegrating tablet  Every 8 hours PRN     02/10/19 0222    acetaminophen (TYLENOL) 500 MG tablet  Every 8 hours PRN     02/10/19 0222           Note:  This document was prepared using Dragon voice recognition software and may include unintentional dictation errors.    Nita SickleVeronese, [place], MD 02/10/19 [phone]

## 2019-02-09 NOTE — ED Notes (Signed)
Patient to triage via wheelchair by EMS.  Patient complain of loss dark stool for 2 days with ruq abdominal pain.  BP 178/110, P 98,  Pulse oxi on room air 100%, temp 98.3 (oral).

## 2019-02-09 NOTE — ED Notes (Signed)
Strep cancelled - posterior of throat appears WDL

## 2019-02-09 NOTE — ED Triage Notes (Signed)
Patient presents to Emergency Department via [facility] from home with complaints of "chills, vomiting and diarrhea that looks like black slush with burning throat pain"  Pt denies med hx or surgeries

## 2019-02-10 MED ORDER — ACETAMINOPHEN 500 MG PO TABS: 1000.0000 mg | ORAL_TABLET | Freq: Three times a day (TID) | ORAL | 0 refills | Status: AC | PRN

## 2019-02-10 MED ORDER — ONDANSETRON 4 MG PO TBDP: 4.0000 mg | ORAL_TABLET | Freq: Three times a day (TID) | ORAL | 0 refills | Status: DC | PRN

## 2019-02-10 NOTE — Discharge Instructions (Signed)

## 2019-02-10 NOTE — ED Notes (Signed)
Patient states he feel better and is ready to be discharged. Dr. Alfred Levins aware.

## 2019-02-11 LAB — NOVEL CORONAVIRUS, NAA (HOSP ORDER, SEND-OUT TO REF LAB; TAT 18-24 HRS): SARS-CoV-2, NAA: NOT DETECTED

## 2019-07-15 ENCOUNTER — Encounter: Payer: Self-pay | Admitting: *Deleted

## 2019-07-15 ENCOUNTER — Other Ambulatory Visit: Payer: Self-pay

## 2019-07-15 DIAGNOSIS — F17210 Nicotine dependence, cigarettes, uncomplicated: Secondary | ICD-10-CM | POA: Insufficient documentation

## 2019-07-15 DIAGNOSIS — G43009 Migraine without aura, not intractable, without status migrainosus: Secondary | ICD-10-CM | POA: Insufficient documentation

## 2019-07-15 NOTE — ED Triage Notes (Signed)
Pt reports a headache for 1 day.  Taking meds without relief.  No n/v/d  Pt alert   Speech clear.

## 2019-07-16 ENCOUNTER — Emergency Department
Admission: EM | Admit: 2019-07-16 | Discharge: 2019-07-16 | Disposition: A | Discharge status: Home / Self Care | Payer: Self-pay | Attending: Emergency Medicine | Admitting: Emergency Medicine

## 2019-07-16 DIAGNOSIS — G43009 Migraine without aura, not intractable, without status migrainosus: Secondary | ICD-10-CM

## 2019-07-16 MED ORDER — DIPHENHYDRAMINE HCL 50 MG/ML IJ SOLN
25.0000 mg | Freq: Once | INTRAMUSCULAR | Status: AC
  Administered 2019-07-16: 08:00:00 25 mg via INTRAMUSCULAR
  Filled 2019-07-16: qty 1

## 2019-07-16 MED ORDER — DEXAMETHASONE SODIUM PHOSPHATE 10 MG/ML IJ SOLN
10.0000 mg | Freq: Once | INTRAMUSCULAR | Status: AC
  Administered 2019-07-16: 08:00:00 10 mg via INTRAMUSCULAR
  Filled 2019-07-16: qty 1

## 2019-07-16 MED ORDER — PROCHLORPERAZINE EDISYLATE 10 MG/2ML IJ SOLN
10.0000 mg | Freq: Once | INTRAMUSCULAR | Status: AC
  Administered 2019-07-16: 08:00:00 10 mg via INTRAMUSCULAR
  Filled 2019-07-16: qty 2

## 2019-07-16 MED ORDER — BUTALBITAL-APAP-CAFFEINE 50-325-40 MG PO TABS: 1.0000 | ORAL_TABLET | Freq: Four times a day (QID) | ORAL | 0 refills | Status: AC | PRN

## 2019-07-16 NOTE — ED Provider Notes (Signed)
[facility] Emergency Department Provider Note  ____________________________________________   First MD Initiated Contact with Patient 07/16/19 0725     (approximate)  I have reviewed the triage vital signs and the nursing notes.   HISTORY  Chief Complaint Headache    HPI Nathaniel Freeman is a 39 y.o. male here with headache.  The patient states he has a lifelong history of intermittent headaches.  States that yesterday, he began to develop gradual onset of initially mild and progressively worsening bilateral retrobulbar headache.  He describes it as a tension type, aching, throbbing, headache that is similar to his usual migraines.  He states it is worse with bright lights which is normal for him.  He believes this is due to possible increased stress recently.  He denies any preceding trauma.  No fevers or chills.  No neck stiffness.  No focal numbness or weakness.  No vision changes.  He has taken Tylenol without significant relief.  No other medications.  He does not currently have a neurologist.  No thunderclap onset.        No past medical history on file.  There are no problems to display for this patient.   No past surgical history on file.  Prior to Admission medications   Medication Sig Start Date End Date Taking? Authorizing Provider  acetaminophen (TYLENOL) 500 MG tablet Take 2 tablets (1,000 mg total) by mouth every 8 (eight) hours as needed for mild pain, moderate pain, fever or headache. 02/10/19 02/10/20  Nita Sickle, MD  butalbital-acetaminophen-caffeine (FIORICET) [phone] MG tablet Take 1-2 tablets by mouth every 6 (six) hours as needed for headache or migraine. 07/16/19 07/15/20  Shaune Pollack, MD    Allergies Patient has no known allergies.  No family history on file.  Social History Social History   Tobacco Use  . Smoking status: Current Every Day Smoker    Packs/day: 0.20    Types: Cigarettes  . Smokeless tobacco:  Never Used  Substance Use Topics  . Alcohol use: Yes    Comment: occasional  . Drug use: Yes    Types: Marijuana    Comment: last smoked in the last 24 hours    Review of Systems  Review of Systems  Constitutional: Positive for fatigue. Negative for chills and fever.  HENT: Negative for sore throat.   Respiratory: Negative for shortness of breath.   Cardiovascular: Negative for chest pain.  Gastrointestinal: Negative for abdominal pain.  Genitourinary: Negative for flank pain.  Musculoskeletal: Negative for neck pain.  Skin: Negative for rash and wound.  Allergic/Immunologic: Negative for immunocompromised state.  Neurological: Positive for headaches. Negative for weakness and numbness.  Hematological: Does not bruise/bleed easily.  All other systems reviewed and are negative.    ____________________________________________  PHYSICAL EXAM:      VITAL SIGNS: ED Triage Vitals  Enc Vitals Group     BP 07/15/19 2241 128/88     Pulse Rate 07/15/19 2241 94     Resp 07/15/19 2241 16     Temp 07/15/19 2241 (!) 97.5 F (36.4 C)     Temp Source 07/15/19 2241 Oral     SpO2 07/15/19 2241 98 %     Weight 07/15/19 2243 180 lb (81.6 kg)     Height 07/15/19 2243 6' (1.829 m)     Head Circumference --      Peak Flow --      Pain Score 07/15/19 2254 10     Pain Loc --  Pain Edu? --      Excl. in GC? --      Physical Exam Vitals and nursing note reviewed.  Constitutional:      General: He is not in acute distress.    Appearance: He is well-developed.  HENT:     Head: Normocephalic and atraumatic.  Eyes:     Conjunctiva/sclera: Conjunctivae normal.  Cardiovascular:     Rate and Rhythm: Normal rate and regular rhythm.     Heart sounds: Normal heart sounds. No murmur. No friction rub.  Pulmonary:     Effort: Pulmonary effort is normal. No respiratory distress.     Breath sounds: Normal breath sounds. No wheezing or rales.  Abdominal:     General: There is no  distension.     Palpations: Abdomen is soft.     Tenderness: There is no abdominal tenderness.  Musculoskeletal:     Cervical back: Neck supple.  Skin:    General: Skin is warm.     Capillary Refill: Capillary refill takes less than 2 seconds.  Neurological:     Mental Status: He is alert and oriented to person, place, and time.     GCS: GCS eye subscore is 4. GCS verbal subscore is 5. GCS motor subscore is 6.     Motor: No abnormal muscle tone.     Comments: Neurological Exam:  Mental Status: Alert and oriented to person, place, and time. Attention and concentration normal. Speech clear. Recent memory is intact. Cranial Nerves: Visual fields grossly intact. EOMI and PERRLA. No nystagmus noted. Facial sensation intact at forehead, maxillary cheek, and chin/mandible bilaterally. No facial asymmetry or weakness. Hearing grossly normal. Uvula is midline, and palate elevates symmetrically. Normal SCM and trapezius strength. Tongue midline without fasciculations. Motor: Muscle strength 5/5 in proximal and distal UE and LE bilaterally. No pronator drift. Muscle tone normal.  Sensation: Intact to light touch in upper and lower extremities distally bilaterally.  Gait: Normal without ataxia. Coordination: Normal FTN bilaterally.          ____________________________________________   LABS (all labs ordered are listed, but only abnormal results are displayed)  Labs Reviewed - No data to display  ____________________________________________  EKG: None ________________________________________  RADIOLOGY All imaging, including plain films, CT scans, and ultrasounds, independently reviewed by me, and interpretations confirmed via formal radiology reads.  ED MD interpretation:   None  Official radiology report(s): No results found.  ____________________________________________  PROCEDURES   Procedure(s) performed (including Critical  Care):  Procedures  ____________________________________________  INITIAL IMPRESSION / MDM / ASSESSMENT AND PLAN / ED COURSE  As part of my medical decision making, I reviewed the following data within the electronic MEDICAL RECORD NUMBER Nursing notes reviewed and incorporated, Old chart reviewed, Notes from prior ED visits, and [place] Controlled Substance Database       *[place] was evaluated in Emergency Department on 07/16/2019 for the symptoms described in the history of present illness. He was evaluated in the context of the global COVID-19 pandemic, which necessitated consideration that the patient might be at risk for infection with the SARS-CoV-2 virus that causes COVID-19. Institutional protocols and algorithms that pertain to the evaluation of patients at risk for COVID-19 are in a state of rapid change based on information released by regulatory bodies including the CDC and federal and state organizations. These policies and algorithms were followed during the patient's care in the ED.  Some ED evaluations and interventions may be delayed as a result of limited  staffing during the pandemic.*     Medical Decision Making:  39 yo M here with likely migraine vs tension type headache. No fever, chills, neck pain/stiffness or signs of meningitis or encephalitis. No focal numbness, weakness, or signs of CVA or intracranial mass/lesion. HA is similar to his usual migraines with no red flags. HA began gradually and is not concerning for [facility]. He has been stable and improving in waiting room. Will treat with IM meds, outpt fioricet PRN and rest/fluids. Outpt follow-up encouraged.  ____________________________________________  FINAL CLINICAL IMPRESSION(S) / ED DIAGNOSES  Final diagnoses:  Migraine without aura and without status migrainosus, not intractable     MEDICATIONS GIVEN DURING THIS VISIT:  Medications  prochlorperazine (COMPAZINE) injection 10 mg (10 mg Intramuscular Given 07/16/19  0753)  diphenhydrAMINE (BENADRYL) injection 25 mg (25 mg Intramuscular Given 07/16/19 0753)  dexamethasone (DECADRON) injection 10 mg (10 mg Intramuscular Given 07/16/19 0753)     ED Discharge Orders         Ordered    butalbital-acetaminophen-caffeine (FIORICET) 50-325-40 MG tablet  Every 6 hours PRN     07/16/19 0802           Note:  This document was prepared using Dragon voice recognition software and may include unintentional dictation errors.   Duffy Bruce, MD 07/16/19 [phone]

## 2019-07-16 NOTE — ED Notes (Signed)
See triage note   Presents with headache since yesterday   States he thinks it is coming from not wearing glasses  No fever or n/v  Min relief with tylenol

## 2019-09-16 ENCOUNTER — Emergency Department: Payer: Self-pay

## 2019-09-16 ENCOUNTER — Emergency Department
Admission: EM | Admit: 2019-09-16 | Discharge: 2019-09-16 | Disposition: A | Discharge status: Home / Self Care | Payer: Self-pay | Attending: Emergency Medicine | Admitting: Emergency Medicine

## 2019-09-16 ENCOUNTER — Other Ambulatory Visit: Payer: Self-pay

## 2019-09-16 DIAGNOSIS — G8929 Other chronic pain: Secondary | ICD-10-CM | POA: Insufficient documentation

## 2019-09-16 DIAGNOSIS — M25562 Pain in left knee: Secondary | ICD-10-CM | POA: Insufficient documentation

## 2019-09-16 DIAGNOSIS — F17210 Nicotine dependence, cigarettes, uncomplicated: Secondary | ICD-10-CM | POA: Insufficient documentation

## 2019-09-16 MED ORDER — NAPROXEN 500 MG PO TABS: 500.0000 mg | ORAL_TABLET | Freq: Two times a day (BID) | ORAL | Status: DC

## 2019-09-16 MED ORDER — NAPROXEN 500 MG PO TABS
500.0000 mg | ORAL_TABLET | Freq: Once | ORAL | Status: AC
  Administered 2019-09-16: 09:00:00 500 mg via ORAL
  Filled 2019-09-16: qty 1

## 2019-09-16 MED ORDER — TRAMADOL HCL 50 MG PO TABS
50.0000 mg | ORAL_TABLET | Freq: Once | ORAL | Status: AC
  Administered 2019-09-16: 09:00:00 50 mg via ORAL
  Filled 2019-09-16: qty 1

## 2019-09-16 NOTE — Discharge Instructions (Signed)
No acute findings on x-ray of your left knee.  Advised to schedule appointment for orthopedics for definitive evaluation and treatment.  Consult sent today.

## 2019-09-16 NOTE — ED Triage Notes (Signed)
Patient c/o right hip and left leg pain. Patient denies new injury. Patient reports hx of previous injuries to bilateral legs.

## 2019-09-16 NOTE — ED Provider Notes (Signed)
[facility] Emergency Department Provider Note   ____________________________________________   First MD Initiated Contact with Patient 09/16/19 [phone]     (approximate)  I have reviewed the triage vital signs and the nursing notes.   HISTORY  Chief Complaint Leg Pain    HPI Nathaniel Freeman is a 39 y.o. male patient complain of chronic knee pain status post 2 twisting incidents over a year ago.  Patient stated right hip pain secondary atypical gait of the left leg and compensating with the right hip.  Patient denies trauma to the right hip.  Patient rates his pain as 8/10.  Patient described pain as "achy".  No palliative measures for complaint.  Patient x-rays of the left knee in 2018 in 2019 without acute findings.     History reviewed. No pertinent past medical history.  There are no problems to display for this patient.   History reviewed. No pertinent surgical history.  Prior to Admission medications   Medication Sig Start Date End Date Taking? Authorizing Provider  acetaminophen (TYLENOL) 500 MG tablet Take 2 tablets (1,000 mg total) by mouth every 8 (eight) hours as needed for mild pain, moderate pain, fever or headache. 02/10/19 02/10/20  Nita Sickle, MD  butalbital-acetaminophen-caffeine (FIORICET) [phone] MG tablet Take 1-2 tablets by mouth every 6 (six) hours as needed for headache or migraine. 07/16/19 07/15/20  Shaune Pollack, MD  naproxen (NAPROSYN) 500 MG tablet Take 1 tablet (500 mg total) by mouth 2 (two) times daily with a meal. 09/16/19   Joni Reining, PA-C    Allergies Patient has no known allergies.  No family history on file.  Social History Social History   Tobacco Use  . Smoking status: Current Every Day Smoker    Packs/day: 0.20    Types: Cigarettes  . Smokeless tobacco: Never Used  Substance Use Topics  . Alcohol use: Yes    Comment: occasional  . Drug use: Yes    Types: Marijuana    Comment: last smoked in  the last 24 hours    Review of Systems Constitutional: No fever/chills Eyes: No visual changes. ENT: No sore throat. Cardiovascular: Denies chest pain. Respiratory: Denies shortness of breath. Gastrointestinal: No abdominal pain.  No nausea, no vomiting.  No diarrhea.  No constipation. Genitourinary: Negative for dysuria. Musculoskeletal: Right hip and left knee pain. Skin: Negative for rash. Neurological: Negative for headaches, focal weakness or numbness.   ____________________________________________   PHYSICAL EXAM:  VITAL SIGNS: ED Triage Vitals [09/16/19 0413]  Enc Vitals Group     BP 109/71     Pulse Rate 90     Resp 15     Temp 98.5 F (36.9 C)     Temp src      SpO2 95 %     Weight 184 lb 15.5 oz (83.9 kg)     Height 6' (1.829 m)     Head Circumference      Peak Flow      Pain Score 10     Pain Loc      Pain Edu?      Excl. in GC?     Constitutional: Alert and oriented. Well appearing and in no acute distress. Cardiovascular: Normal rate, regular rhythm. Grossly normal heart sounds.  Good peripheral circulation. Respiratory: Normal respiratory effort.  No retractions. Lungs CTAB. Musculoskeletal: No obvious deformity to the right hip.  No leg length discrepancy.  No obvious deformity to the left knee.  No edema or ecchymosis  appreciated.   Neurologic:  Normal speech and language. No gross focal neurologic deficits are appreciated. No gait instability. Skin:  Skin is warm, dry and intact. No rash noted. Psychiatric: Mood and affect are normal. Speech and behavior are normal.  ____________________________________________   LABS (all labs ordered are listed, but only abnormal results are displayed)  Labs Reviewed - No data to display ____________________________________________  EKG   ____________________________________________  RADIOLOGY  ED MD interpretation:    Official radiology report(s): DG Knee Complete 4 Views Left  Result Date:  09/16/2019 CLINICAL DATA:  Chronic knee pain. EXAM: LEFT KNEE - COMPLETE 4+ VIEW COMPARISON:  05/13/2017 FINDINGS: The joint spaces are maintained. No acute bony abnormality, osteochondral lesion or obvious joint effusion. IMPRESSION: Normal left knee radiographs. Electronically Signed   By: Rudie Meyer M.D.   On: 09/16/2019 08:06    ____________________________________________   PROCEDURES  Procedure(s) performed (including Critical Care):  Procedures   ____________________________________________   INITIAL IMPRESSION / ASSESSMENT AND PLAN / ED COURSE  As part of my medical decision making, I reviewed the following data within the electronic MEDICAL RECORD NUMBER     Patient presents for 3 years of intermitting left knee pain.  Patient states he has developed right hip pain secondary to compensating with an atypical gait of the left lower extremity.  Discussed present and previous negative x-ray findings with patient.  Advised patient to follow orthopedic for definitive evaluation and treatment.    Nathaniel Freeman was evaluated in Emergency Department on 09/16/2019 for the symptoms described in the history of present illness. He was evaluated in the context of the global COVID-19 pandemic, which necessitated consideration that the patient might be at risk for infection with the SARS-CoV-2 virus that causes COVID-19. Institutional protocols and algorithms that pertain to the evaluation of patients at risk for COVID-19 are in a state of rapid change based on information released by regulatory bodies including the CDC and federal and state organizations. These policies and algorithms were followed during the patient's care in the ED.       ____________________________________________   FINAL CLINICAL IMPRESSION(S) / ED DIAGNOSES  Final diagnoses:  Chronic patellofemoral pain of left knee     ED Discharge Orders         Ordered    naproxen (NAPROSYN) 500 MG tablet  2 times daily with  meals     Discontinue  Reprint     09/16/19 0827           Note:  This document was prepared using Dragon voice recognition software and may include unintentional dictation errors.    Joni Reining, PA-C 09/16/19 0830    Shaune Pollack, MD 09/17/19 [phone]

## 2019-12-25 ENCOUNTER — Encounter: Payer: Self-pay | Admitting: Family Medicine

## 2019-12-25 ENCOUNTER — Ambulatory Visit: Payer: Self-pay | Admitting: Family Medicine

## 2019-12-25 ENCOUNTER — Other Ambulatory Visit: Payer: Self-pay

## 2019-12-25 DIAGNOSIS — Z113 Encounter for screening for infections with a predominantly sexual mode of transmission: Secondary | ICD-10-CM

## 2019-12-25 LAB — GRAM STAIN

## 2019-12-25 NOTE — Progress Notes (Addendum)
   [facility] Department STI clinic/screening visit  Subjective:  Nathaniel Freeman is a 39 y.o. male being seen today for an STI screening visit. The patient reports they do not have symptoms.    Patient has the following medical conditions:  There are no problems to display for this patient.    Chief Complaint  Patient presents with  . SEXUALLY TRANSMITTED DISEASE    screening     HPI  Patient in clinic today, patient reports that he was informed by partner that she tested positive for HSV and was concerned about being a contact.  Patient denies s/sx.     See flowsheet for further details and programmatic requirements.    The following portions of the patient's history were reviewed and updated as appropriate: allergies, current medications, past medical history, past social history, past surgical history and problem list.  Objective:  There were no vitals filed for this visit.  Physical Exam Constitutional:      Appearance: Normal appearance.  HENT:     Head: Normocephalic.     Comments: In scalp, brows and lashes: no nits, no facial hair loss, head balding     Mouth/Throat:     Mouth: Mucous membranes are moist.     Pharynx: Oropharynx is clear. No oropharyngeal exudate or posterior oropharyngeal erythema.  Genitourinary:    Comments: No lice, nits, or pest, no lesions or odor discharge.  Denies pain or tenderness with paplation of testicles.  No lesions, ulcers or masses present.   Musculoskeletal:     Cervical back: Normal range of motion and neck supple.  Lymphadenopathy:     Cervical: No cervical adenopathy.  Skin:    General: Skin is warm and dry.     Findings: No bruising, erythema, lesion or rash.  Neurological:     Mental Status: He is alert.  Psychiatric:        Mood and Affect: Mood normal.        Behavior: Behavior normal.       Assessment and Plan:  Nathaniel Freeman is a 39 y.o. male presenting to the [facility] Department  for STI screening  1. Screening examination for venereal disease  - Gonococcus culture - Gram stain - HIV [place] [facility] - Syphilis Serology, [place]   Patient does not have STI symptoms Patient accepted all screenings including gram stain,  GC and bloodwork for HIV/RPR.  Patient meets criteria for HepB screening? No. Ordered? Patient declined testing today.  Patient meets criteria for HepC screening? Yes. Ordered? No - previously tested and no new changes in behaviors since last testing and declined testing today  Recommended condom use with all sex Discussed importance of condom use for STI prevent.  Discussed with patient about the s/sx of HSV and testing at [facility] vs bloodwork. Patient to return to clinic if any blisters opens sores or lesions were to appear.     Treat per gram stain,  No treatment needed.   Discussed time line for [facility] results and that patient will be called with positive results and encouraged patient to call if he had not heard in 2 weeks  Recommended returning for continued or worsening symptoms.   Patient has no other concerns or questions at this time.   No follow-ups on file.  No future appointments.  Wendi Snipes, FNP

## 2019-12-29 LAB — GONOCOCCUS CULTURE

## 2020-03-03 ENCOUNTER — Emergency Department: Payer: Medicaid Other

## 2020-03-03 ENCOUNTER — Emergency Department
Admission: EM | Admit: 2020-03-03 | Discharge: 2020-03-03 | Disposition: A | Discharge status: Home / Self Care | Payer: Medicaid Other | Attending: Emergency Medicine | Admitting: Emergency Medicine

## 2020-03-03 ENCOUNTER — Other Ambulatory Visit: Payer: Self-pay

## 2020-03-03 DIAGNOSIS — R0789 Other chest pain: Secondary | ICD-10-CM | POA: Insufficient documentation

## 2020-03-03 DIAGNOSIS — Z20822 Contact with and (suspected) exposure to covid-19: Secondary | ICD-10-CM | POA: Insufficient documentation

## 2020-03-03 DIAGNOSIS — F17210 Nicotine dependence, cigarettes, uncomplicated: Secondary | ICD-10-CM | POA: Insufficient documentation

## 2020-03-03 LAB — COMPREHENSIVE METABOLIC PANEL
ALT: 24 U/L (ref 0–44)
AST: 38 U/L (ref 15–41)
Albumin: 3.9 g/dL (ref 3.5–5.0)
Alkaline Phosphatase: 68 U/L (ref 38–126)
Anion gap: 9 (ref 5–15)
BUN: 10 mg/dL (ref 6–20)
CO2: 25 mmol/L (ref 22–32)
Calcium: 8.8 mg/dL — ABNORMAL LOW (ref 8.9–10.3)
Chloride: 105 mmol/L (ref 98–111)
Creatinine, Ser: 0.84 mg/dL (ref 0.61–1.24)
GFR, Estimated: >60 mL/min (ref >60)
Glucose, Bld: 86 mg/dL (ref 70–99)
Potassium: 3.7 mmol/L (ref 3.5–5.1)
Sodium: 139 mmol/L (ref 135–145)
Total Bilirubin: 0.8 mg/dL (ref 0.3–1.2)
Total Protein: 7.1 g/dL (ref 6.5–8.1)

## 2020-03-03 LAB — CBC WITH DIFFERENTIAL/PLATELET
Abs Immature Granulocytes: 0.02 10*3/uL (ref 0.00–0.07)
Basophils Absolute: 0.1 10*3/uL (ref 0.0–0.1)
Basophils Relative: 1 %
Eosinophils Absolute: 0.2 10*3/uL (ref 0.0–0.5)
Eosinophils Relative: 2 %
HCT: 43.1 % (ref 39.0–52.0)
Hemoglobin: 14.5 g/dL (ref 13.0–17.0)
Immature Granulocytes: 0 %
Lymphocytes Relative: 45 %
Lymphs Abs: 4.4 10*3/uL — ABNORMAL HIGH (ref 0.7–4.0)
MCH: 29.7 pg (ref 26.0–34.0)
MCHC: 33.6 g/dL (ref 30.0–36.0)
MCV: 88.1 fL (ref 80.0–100.0)
Monocytes Absolute: 0.6 10*3/uL (ref 0.1–1.0)
Monocytes Relative: 6 %
Neutro Abs: 4.6 10*3/uL (ref 1.7–7.7)
Neutrophils Relative %: 46 %
Platelets: 334 10*3/uL (ref 150–400)
RBC: 4.89 MIL/uL (ref 4.22–5.81)
RDW: 14.5 % (ref 11.5–15.5)
WBC: 9.9 10*3/uL (ref 4.0–10.5)
nRBC: 0 % (ref 0.0–0.2)

## 2020-03-03 LAB — RESP PANEL BY RT-PCR (FLU A&B, COVID) ARPGX2
Influenza A by PCR: NEGATIVE
Influenza B by PCR: NEGATIVE
SARS Coronavirus 2 by RT PCR: NEGATIVE

## 2020-03-03 LAB — TROPONIN I (HIGH SENSITIVITY): Troponin I (High Sensitivity): 6 ng/L (ref <18)

## 2020-03-03 MED ORDER — KETOROLAC TROMETHAMINE 60 MG/2ML IM SOLN
30.0000 mg | Freq: Once | INTRAMUSCULAR | Status: AC
  Administered 2020-03-03: 07:00:00 30 mg via INTRAMUSCULAR
  Filled 2020-03-03: qty 2

## 2020-03-03 MED ORDER — NAPROXEN 500 MG PO TABS: 500.0000 mg | ORAL_TABLET | Freq: Two times a day (BID) | ORAL | 2 refills | Status: DC

## 2020-03-03 NOTE — ED Provider Notes (Signed)
[facility] Emergency Department Provider Note   ____________________________________________    I have reviewed the triage vital signs and the nursing notes.   HISTORY  Chief Complaint Chest Pain     HPI Nathaniel Freeman [place] is a 39 y.o. male who presents with chest pain x2 days.  Patient describes tenderness to his superior sternal area consistently over the last 2 days.  Gets worse when he lifts his arm above his head.  Gets worse when he takes a deep breath or pushes on the area.  He reports that he was "sparring "with his brother several days ago and thinks that may be the cause.  Denies shortness of breath cough fevers or chills.  No diaphoresis nausea or vomiting  No past medical history on file.  There are no problems to display for this patient.   No past surgical history on file.  Prior to Admission medications   Medication Sig Start Date End Date Taking? Authorizing Provider  naproxen (NAPROSYN) 500 MG tablet Take 1 tablet (500 mg total) by mouth 2 (two) times daily with a meal. 03/03/20  Yes Jene Every, MD  butalbital-acetaminophen-caffeine (FIORICET) 50-325-40 MG tablet Take 1-2 tablets by mouth every 6 (six) hours as needed for headache or migraine. 07/16/19 07/15/20  Shaune Pollack, MD     Allergies Patient has no known allergies.  No family history on file.  Social History Social History   Tobacco Use  . Smoking status: Current Some Day Smoker    Packs/day: 0.20    Types: Cigarettes  . Smokeless tobacco: Never Used  Vaping Use  . Vaping Use: Never used  Substance Use Topics  . Alcohol use: Yes    Alcohol/week: 2.0 standard drinks    Types: 2 Cans of beer per week    Comment: occasional  . Drug use: Yes    Types: Marijuana    Comment: last smoked in the last 24 hours    Review of Systems  Constitutional: No fever/chills Eyes: No visual changes.  ENT: No sore throat. Cardiovascular: As above Respiratory: Denies  shortness of breath. Gastrointestinal: No abdominal pain.  No nausea, no vomiting.   Genitourinary: Negative for dysuria. Musculoskeletal: Negative for back pain. Skin: Negative for rash. Neurological: Negative for headaches or weakness   ____________________________________________   PHYSICAL EXAM:  VITAL SIGNS: ED Triage Vitals [03/03/20 0528]  Enc Vitals Group     BP (!) 133/93     Pulse Rate 88     Resp 20     Temp 97.8 F (36.6 C)     Temp Source Oral     SpO2 98 %     Weight 86.2 kg (190 lb)     Height 1.854 m (6\' 1" )     Head Circumference      Peak Flow      Pain Score 9     Pain Loc      Pain Edu?      Excl. in GC?     Constitutional: Alert and oriented. No acute distress.   Nose: No congestion/rhinnorhea. Mouth/Throat: Mucous membranes are moist.   Neck:  Painless ROM Cardiovascular: Normal rate, regular rhythm. Grossly normal heart sounds.  Good peripheral circulation.  Patient with tenderness to the superior sternum primarily along the left border, no bruising or rash or swelling Respiratory: Normal respiratory effort.  No retractions. Lungs CTAB. Gastrointestinal: Soft and nontender. No distention.   Musculoskeletal: No lower extremity tenderness nor edema.  Warm and  well perfused Neurologic:  Normal speech and language. No gross focal neurologic deficits are appreciated.  Skin:  Skin is warm, dry and intact. No rash noted. Psychiatric: Mood and affect are normal. Speech and behavior are normal.  ____________________________________________   LABS (all labs ordered are listed, but only abnormal results are displayed)  Labs Reviewed  CBC WITH DIFFERENTIAL/PLATELET - Abnormal; Notable for the following components:      Result Value   Lymphs Abs 4.4 (*)    All other components within normal limits  COMPREHENSIVE METABOLIC PANEL - Abnormal; Notable for the following components:   Calcium 8.8 (*)    All other components within normal limits  RESP  PANEL BY RT-PCR (FLU A&B, COVID) [id number]  TROPONIN I (HIGH SENSITIVITY)   ____________________________________________  EKG  ED ECG REPORT I, Jene Every, the attending physician, personally viewed and interpreted this ECG.  Date: 03/03/2020  Rhythm: normal sinus rhythm QRS Axis: normal Intervals: normal ST/T Wave abnormalities: normal Narrative Interpretation: LVH  ____________________________________________  RADIOLOGY  Chest x-ray viewed by me, unremarkable study ____________________________________________   PROCEDURES  Procedure(s) performed: No  Procedures   Critical Care performed: No ____________________________________________   INITIAL IMPRESSION / ASSESSMENT AND PLAN / ED COURSE  Pertinent labs & imaging results that were available during my care of the patient were reviewed by me and considered in my medical decision making (see chart for details).  Patient presents with chest wall pain as described above.  Tender to palpation to the left sternal border superiorly.  No clear injury noted to the area however the person was sparring with his brother recently.  Afebrile, no suspicion for pneumonia.  Symptoms been ongoing for 2 days and worse with movement.  PERC negative, very low risk for PE.  Exam is most consistent with musculoskeletal pain.  CBC CMP and troponin are reassuring, EKG consistent with LVH but otherwise unremarkable.  Will treat with IM Toradol, naproxen, outpatient follow-up as needed    ____________________________________________   FINAL CLINICAL IMPRESSION(S) / ED DIAGNOSES  Final diagnoses:  Chest wall pain        Note:  This document was prepared using Dragon voice recognition software and may include unintentional dictation errors.   Jene Every, MD 03/03/20 [phone]

## 2020-03-03 NOTE — ED Notes (Signed)
Patient verbalizes understanding of discharge instructions. Opportunity for questioning and answers were provided. Armband removed by staff, pt discharged from ED. Ambulated out to lobby  

## 2020-03-03 NOTE — ED Triage Notes (Signed)
Pt in with co chest pain x 2 days, denies any hx of heart disease. PT has had cough, pain worse with inspiration.

## 2021-04-06 ENCOUNTER — Emergency Department: Payer: Self-pay

## 2021-04-06 ENCOUNTER — Emergency Department
Admission: EM | Admit: 2021-04-06 | Discharge: 2021-04-06 | Disposition: A | Discharge status: Home / Self Care | Payer: Self-pay | Attending: Emergency Medicine | Admitting: Emergency Medicine

## 2021-04-06 ENCOUNTER — Other Ambulatory Visit: Payer: Self-pay

## 2021-04-06 DIAGNOSIS — M25512 Pain in left shoulder: Secondary | ICD-10-CM

## 2021-04-06 DIAGNOSIS — S46912A Strain of unspecified muscle, fascia and tendon at shoulder and upper arm level, left arm, initial encounter: Secondary | ICD-10-CM | POA: Insufficient documentation

## 2021-04-06 DIAGNOSIS — X500XXA Overexertion from strenuous movement or load, initial encounter: Secondary | ICD-10-CM | POA: Insufficient documentation

## 2021-04-06 DIAGNOSIS — F17210 Nicotine dependence, cigarettes, uncomplicated: Secondary | ICD-10-CM | POA: Insufficient documentation

## 2021-04-06 MED ORDER — HYDROCODONE-ACETAMINOPHEN 5-325 MG PO TABS: 1.0000 | ORAL_TABLET | Freq: Four times a day (QID) | ORAL | 0 refills | Status: AC | PRN

## 2021-04-06 MED ORDER — ETODOLAC 400 MG PO TABS: 400.0000 mg | ORAL_TABLET | Freq: Two times a day (BID) | ORAL | 0 refills | Status: AC

## 2021-04-06 MED ORDER — HYDROCODONE-ACETAMINOPHEN 5-325 MG PO TABS
1.0000 | ORAL_TABLET | Freq: Once | ORAL | Status: AC
  Administered 2021-04-06: 1 via ORAL
  Filled 2021-04-06: qty 1

## 2021-04-06 MED ORDER — KETOROLAC TROMETHAMINE 30 MG/ML IJ SOLN
30.0000 mg | Freq: Once | INTRAMUSCULAR | Status: AC
  Administered 2021-04-06: 30 mg via INTRAMUSCULAR
  Filled 2021-04-06: qty 1

## 2021-04-06 NOTE — ED Provider Notes (Signed)
[facility] Provider Note    Event Date/Time   First MD Initiated Contact with Patient 04/06/21 1331     (approximate)   History   Shoulder Pain   HPI  Nathaniel Freeman [place] is a 41 y.o. male   presents to the ED with complaint of left shoulder pain after lifting approximately 100 pounds plus while working out yesterday.  Patient is unaware of any previous injury to his shoulder.  He has not taken any over-the-counter medication for pain.  He reports that he has increased pain with movement.  Patient smokes cigarettes but denies any other medical history.      Physical Exam   Triage Vital Signs: ED Triage Vitals  Enc Vitals Group     BP 04/06/21 1321 (!) 133/106     Pulse Rate 04/06/21 1321 96     Resp 04/06/21 1321 16     Temp 04/06/21 1321 98.2 F (36.8 C)     Temp Source 04/06/21 1321 Oral     SpO2 04/06/21 1321 98 %     Weight 04/06/21 1328 190 lb 0.6 oz (86.2 kg)     Height 04/06/21 1328 6\' 1"  (1.854 m)     Head Circumference --      Peak Flow --      Pain Score --      Pain Loc --      Pain Edu? --      Excl. in GC? --     Most recent vital signs: Vitals:   04/06/21 1321 04/06/21 1453  BP: (!) 133/106 (!) 130/102  Pulse: 96 75  Resp: 16 16  Temp: 98.2 F (36.8 C)   SpO2: 98% 100%     General: Awake, no distress.  CV:  Good peripheral perfusion.  Heart regular rate and rhythm without murmur. Resp:  Normal effort.  Lungs are clear bilaterally. Abd:  No distention.  Other:  Examination left shoulder there is no gross deformity, erythema, warmth or skin discoloration.  No soft tissue edema present.  There is point tenderness on palpation of the [facility] joint and posteriorly.  Range of motion of the upper extremity is mildly restricted secondary to increased pain.  Patient is able to abduct at approximately 45 degrees without any pain.  Pulses present distally.   ED Results / Procedures / Treatments   Labs (all labs ordered are listed, but  only abnormal results are displayed) Labs Reviewed - No data to display    RADIOLOGY X-ray images of the left shoulder were reviewed by myself and radiology report was reviewed. Small minimally displaced fracture of the anterior inferior glenoid.     PROCEDURES:  Critical Care performed:   Procedures   MEDICATIONS ORDERED IN ED: Medications  HYDROcodone-acetaminophen (NORCO/VICODIN) 5-325 MG per tablet 1 tablet (1 tablet Oral Given 04/06/21 1356)  ketorolac (TORADOL) 30 MG/ML injection 30 mg (30 mg Intramuscular Given 04/06/21 1452)     IMPRESSION / MDM / ASSESSMENT AND PLAN / ED COURSE  I reviewed the triage vital signs and the nursing notes.   Differential diagnosis includes, but is not limited to, shoulder strain, acute joint pain, degenerative joint disease, possible rotator cuff tear.   41 year old male presents to the ED with complaint of left shoulder pain after lifting weights that home yesterday.  Patient is unaware of any past injury to his left shoulder.  Range of motion with without crepitus and patient was able to abduct at approximate 45 degrees without any  difficulty.  X-ray showed a small minimally displaced fracture of the anterior inferior glenoid.  There is a possibility of a tear with weightlifting however patient hurts posteriorly and it is uncertain if this is an old injury.  Patient was given Norco and Toradol IM while in the ED.  A prescription for etodolac and hydrocodone was sent to the pharmacy for him to take with a note to remain out of work and no lifting more than 5 pounds.  He is encouraged also to use ice or heat to his muscles as needed for discomfort.  If he continues to have problems with the shoulder he is to follow-up with Dr. Okey Dupre who is on-call for orthopedics.      FINAL CLINICAL IMPRESSION(S) / ED DIAGNOSES   Final diagnoses:  Acute pain of left shoulder  Strain of left shoulder, initial encounter     Rx / DC Orders   ED  Discharge Orders          Ordered    etodolac (LODINE) 400 MG tablet  2 times daily        04/06/21 1457    HYDROcodone-acetaminophen (NORCO/VICODIN) 5-325 MG tablet  Every 6 hours PRN        04/06/21 1457             Note:  This document was prepared using Dragon voice recognition software and may include unintentional dictation errors.   Tommi Rumps, PA-C 04/06/21 1552    Jene Every, MD 04/06/21 [id number]

## 2021-04-06 NOTE — Discharge Instructions (Signed)
Follow-up with Dr. Sharlet Salina who is the orthopedist on-call if your shoulder continues to hurt or not improving.  No lifting more than 5 pounds for 2 to 3 days.  You may use ice or heat to your muscles as needed for discomfort.  2 prescriptions were sent to the pharmacy.  Etodolac is an anti-inflammatory to be taken twice a day with food and the other is a narcotic pain medication should not be taken while driving or operating machinery.

## 2021-04-06 NOTE — ED Triage Notes (Signed)
Pt c/o left shoulder pain after lifting something heavy yesterday.

## 2021-04-06 NOTE — ED Notes (Signed)
See triage note  presents with left shoulder pain states he is not sure what happened  states he was lifting wts and moving furniture  pian is mainly to posterior shoulder   no deformity noted   increase pain with movement

## 2022-05-23 IMAGING — CR DG CHEST 2V
1 series · 2 of 2 positions shown · non-contrast
Comparison: 02/09/2019.

CLINICAL DATA: Chest pain.

EXAM:
CHEST - 2 VIEW

[Series 1: dg chest 2 view · 0.14mm/px · 2 of 2 slices shown]
[im 1/2]
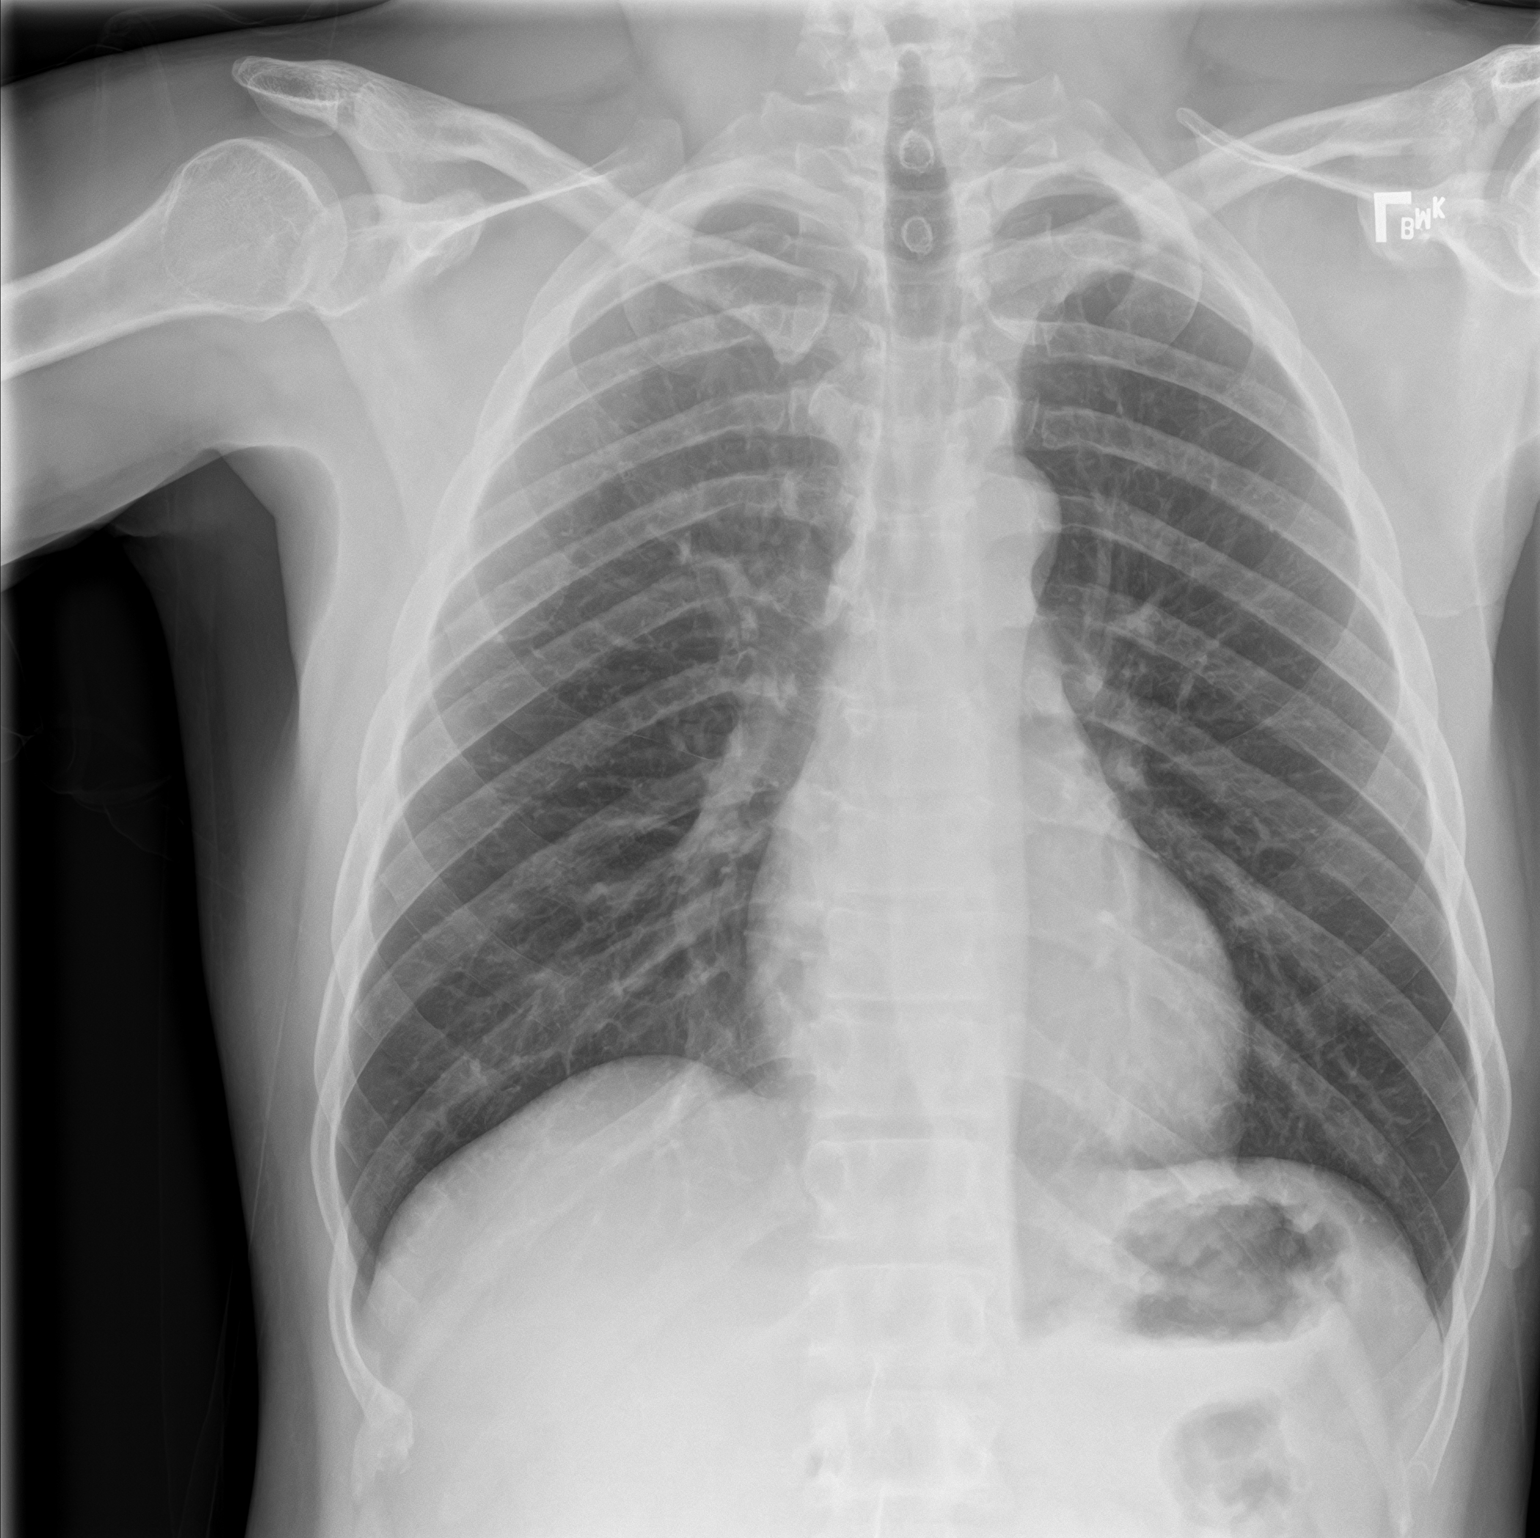
[im 2/2]
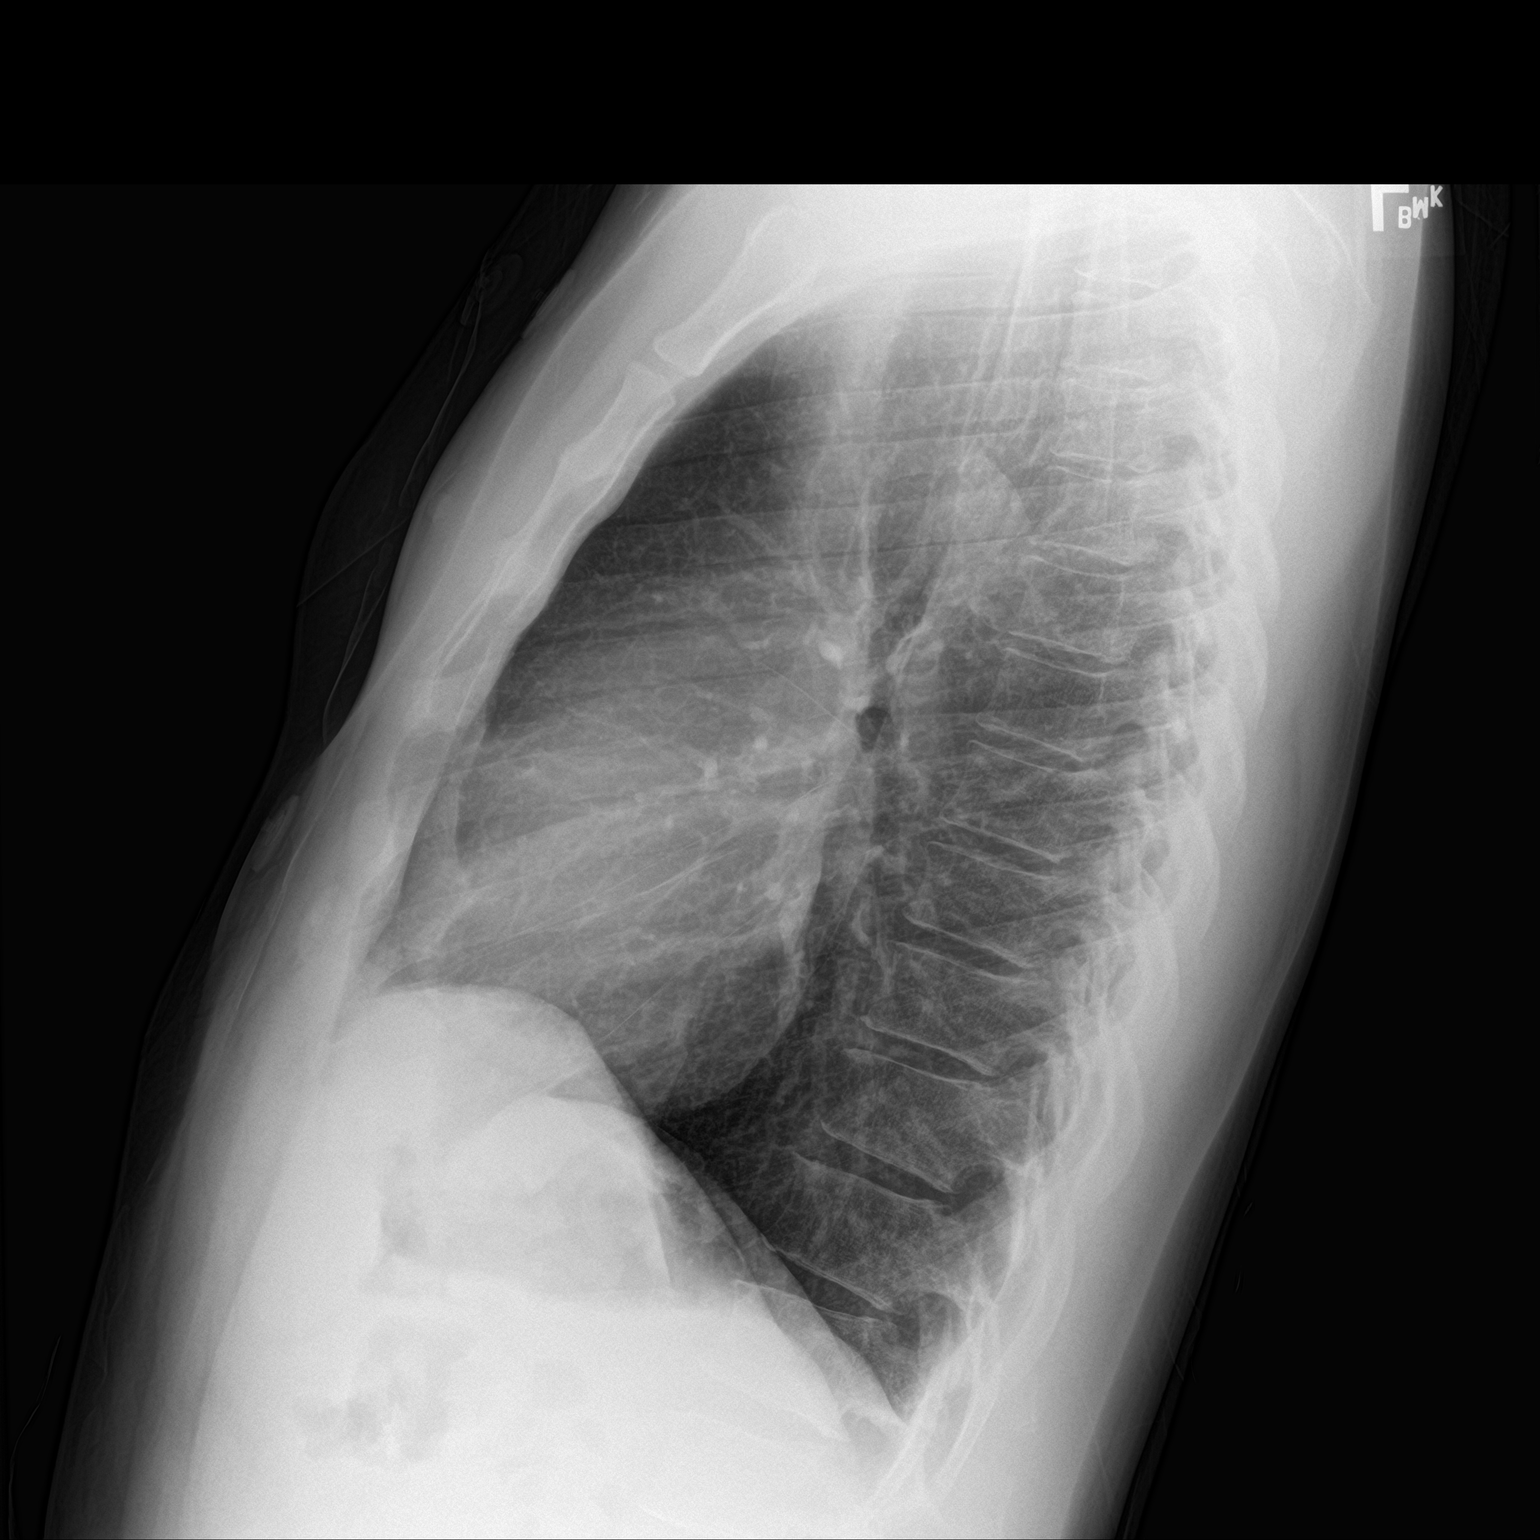

[2 of 2 positions shown; findings below may reference images not displayed]

FINDINGS: Mediastinum hilar structures normal. Lungs are clear. No pleural
effusion or pneumothorax. Heart size normal. No acute bony
abnormality.
IMPRESSION: No acute cardiopulmonary disease.

## 2022-07-25 ENCOUNTER — Emergency Department: Admission: EM | Admit: 2022-07-25 | Payer: BC Managed Care – PPO | Source: Home / Self Care

## 2022-07-25 ENCOUNTER — Emergency Department: Payer: BC Managed Care – PPO

## 2022-07-25 ENCOUNTER — Emergency Department
Admission: EM | Admit: 2022-07-25 | Discharge: 2022-07-25 | Disposition: A | Discharge status: Home / Self Care | Payer: BC Managed Care – PPO | Attending: Emergency Medicine | Admitting: Emergency Medicine

## 2022-07-25 DIAGNOSIS — T59811A Toxic effect of smoke, accidental (unintentional), initial encounter: Secondary | ICD-10-CM | POA: Diagnosis present

## 2022-07-25 DIAGNOSIS — D72829 Elevated white blood cell count, unspecified: Secondary | ICD-10-CM | POA: Diagnosis not present

## 2022-07-25 DIAGNOSIS — X088XXA Exposure to other specified smoke, fire and flames, initial encounter: Secondary | ICD-10-CM

## 2022-07-25 DIAGNOSIS — F17210 Nicotine dependence, cigarettes, uncomplicated: Secondary | ICD-10-CM | POA: Diagnosis not present

## 2022-07-25 DIAGNOSIS — X001XXA Exposure to smoke in uncontrolled fire in building or structure, initial encounter: Secondary | ICD-10-CM | POA: Insufficient documentation

## 2022-07-25 LAB — CBC WITH DIFFERENTIAL/PLATELET
Abs Immature Granulocytes: 0.03 10*3/uL (ref 0.00–0.07)
Abs Immature Granulocytes: 0.05 10*3/uL (ref 0.00–0.07)
Basophils Absolute: 0.1 10*3/uL (ref 0.0–0.1)
Basophils Absolute: 0.1 10*3/uL (ref 0.0–0.1)
Basophils Relative: 1 %
Basophils Relative: 1 %
Eosinophils Absolute: 0.2 10*3/uL (ref 0.0–0.5)
Eosinophils Absolute: 0.2 10*3/uL (ref 0.0–0.5)
Eosinophils Relative: 1 %
Eosinophils Relative: 2 %
HCT: 44.8 % (ref 39.0–52.0)
HCT: 45.3 % (ref 36.0–46.0)
Hemoglobin: 14.7 g/dL (ref 12.0–15.0)
Hemoglobin: 14.9 g/dL (ref 13.0–17.0)
Immature Granulocytes: 0 %
Immature Granulocytes: 0 %
Lymphocytes Relative: 20 %
Lymphocytes Relative: 24 %
Lymphs Abs: 2.5 10*3/uL (ref 0.7–4.0)
Lymphs Abs: 2.8 10*3/uL (ref 0.7–4.0)
MCH: 28.3 pg (ref 26.0–34.0)
MCH: 29.1 pg (ref 26.0–34.0)
MCHC: 32.5 g/dL (ref 30.0–36.0)
MCHC: 33.3 g/dL (ref 30.0–36.0)
MCV: 87.3 fL (ref 80.0–100.0)
MCV: 87.5 fL (ref 80.0–100.0)
Monocytes Absolute: 0.5 10*3/uL (ref 0.1–1.0)
Monocytes Absolute: 0.6 10*3/uL (ref 0.1–1.0)
Monocytes Relative: 4 %
Monocytes Relative: 4 %
Neutro Abs: 8.3 10*3/uL — ABNORMAL HIGH (ref 1.7–7.7)
Neutro Abs: 9.5 10*3/uL — ABNORMAL HIGH (ref 1.7–7.7)
Neutrophils Relative %: 69 %
Neutrophils Relative %: 74 %
Platelets: 349 10*3/uL (ref 150–400)
Platelets: 356 10*3/uL (ref 150–400)
RBC: 5.12 MIL/uL (ref 4.22–5.81)
RBC: 5.19 MIL/uL — ABNORMAL HIGH (ref 3.87–5.11)
RDW: 14.6 % (ref 11.5–15.5)
RDW: 14.6 % (ref 11.5–15.5)
WBC: 11.9 10*3/uL — ABNORMAL HIGH (ref 4.0–10.5)
WBC: 12.8 10*3/uL — ABNORMAL HIGH (ref 4.0–10.5)
nRBC: 0 % (ref 0.0–0.2)
nRBC: 0 % (ref 0.0–0.2)

## 2022-07-25 LAB — COMPREHENSIVE METABOLIC PANEL
ALT: 28 U/L (ref 0–44)
ALT: 31 U/L (ref 0–44)
AST: 38 U/L (ref 15–41)
AST: 38 U/L (ref 15–41)
Albumin: 4.3 g/dL (ref 3.5–5.0)
Albumin: 4.3 g/dL (ref 3.5–5.0)
Alkaline Phosphatase: 61 U/L (ref 38–126)
Alkaline Phosphatase: 61 U/L (ref 38–126)
Anion gap: 9 (ref 5–15)
Anion gap: 9 (ref 5–15)
BUN: 16 mg/dL (ref 6–20)
BUN: 16 mg/dL (ref 6–20)
CO2: 26 mmol/L (ref 22–32)
CO2: 26 mmol/L (ref 22–32)
Calcium: 9 mg/dL (ref 8.9–10.3)
Calcium: 9.1 mg/dL (ref 8.9–10.3)
Chloride: 101 mmol/L (ref 98–111)
Chloride: 102 mmol/L (ref 98–111)
Creatinine, Ser: 1.12 mg/dL (ref 0.61–1.24)
Creatinine, Ser: 1.19 mg/dL — ABNORMAL HIGH (ref 0.44–1.00)
GFR, Estimated: 59 mL/min — ABNORMAL LOW (ref >60)
GFR, Estimated: >60 mL/min (ref >60)
Glucose, Bld: 91 mg/dL (ref 70–99)
Glucose, Bld: 93 mg/dL (ref 70–99)
Potassium: 4.7 mmol/L (ref 3.5–5.1)
Potassium: 5 mmol/L (ref 3.5–5.1)
Sodium: 136 mmol/L (ref 135–145)
Sodium: 137 mmol/L (ref 135–145)
Total Bilirubin: 0.7 mg/dL (ref 0.3–1.2)
Total Bilirubin: 0.8 mg/dL (ref 0.3–1.2)
Total Protein: 7.6 g/dL (ref 6.5–8.1)
Total Protein: 7.6 g/dL (ref 6.5–8.1)

## 2022-07-25 LAB — COOXEMETRY PANEL
Carboxyhemoglobin: 1.8 % — ABNORMAL HIGH (ref 0.5–1.5)
Methemoglobin: <0.7 % (ref 0.0–1.5)
O2 Saturation: 31.5 %
Total hemoglobin: 14.9 g/dL (ref 12.0–16.0)
Total oxygen content: 30.9 %

## 2022-07-25 MED ORDER — ONDANSETRON 4 MG PO TBDP
4.0000 mg | ORAL_TABLET | Freq: Once | ORAL | Status: AC
  Administered 2022-07-25: 4 mg via ORAL
  Filled 2022-07-25: qty 1

## 2022-07-25 MED ORDER — ONDANSETRON 4 MG PO TBDP: 4.0000 mg | ORAL_TABLET | Freq: Three times a day (TID) | ORAL | 0 refills | Status: AC | PRN
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
## 2022-07-25 NOTE — ED Notes (Signed)
Triage and orders entered in incorrectly and should have been entered in on a different pt.
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
## 2022-07-25 NOTE — ED Notes (Signed)
Dc instructions and scripts reviewed with pt no questions or concerns at this time.  
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
## 2023-06-26 IMAGING — CR DG SHOULDER 2+V*L*
1 series · 3 of 3 positions shown · non-contrast
Comparison: None.

CLINICAL DATA: pain/ lifting injury

EXAM:
LEFT SHOULDER - 2+ VIEW

[Series 1: dg shoulder left · 0.14mm/px · 3 of 3 slices shown]
[im 1/3]
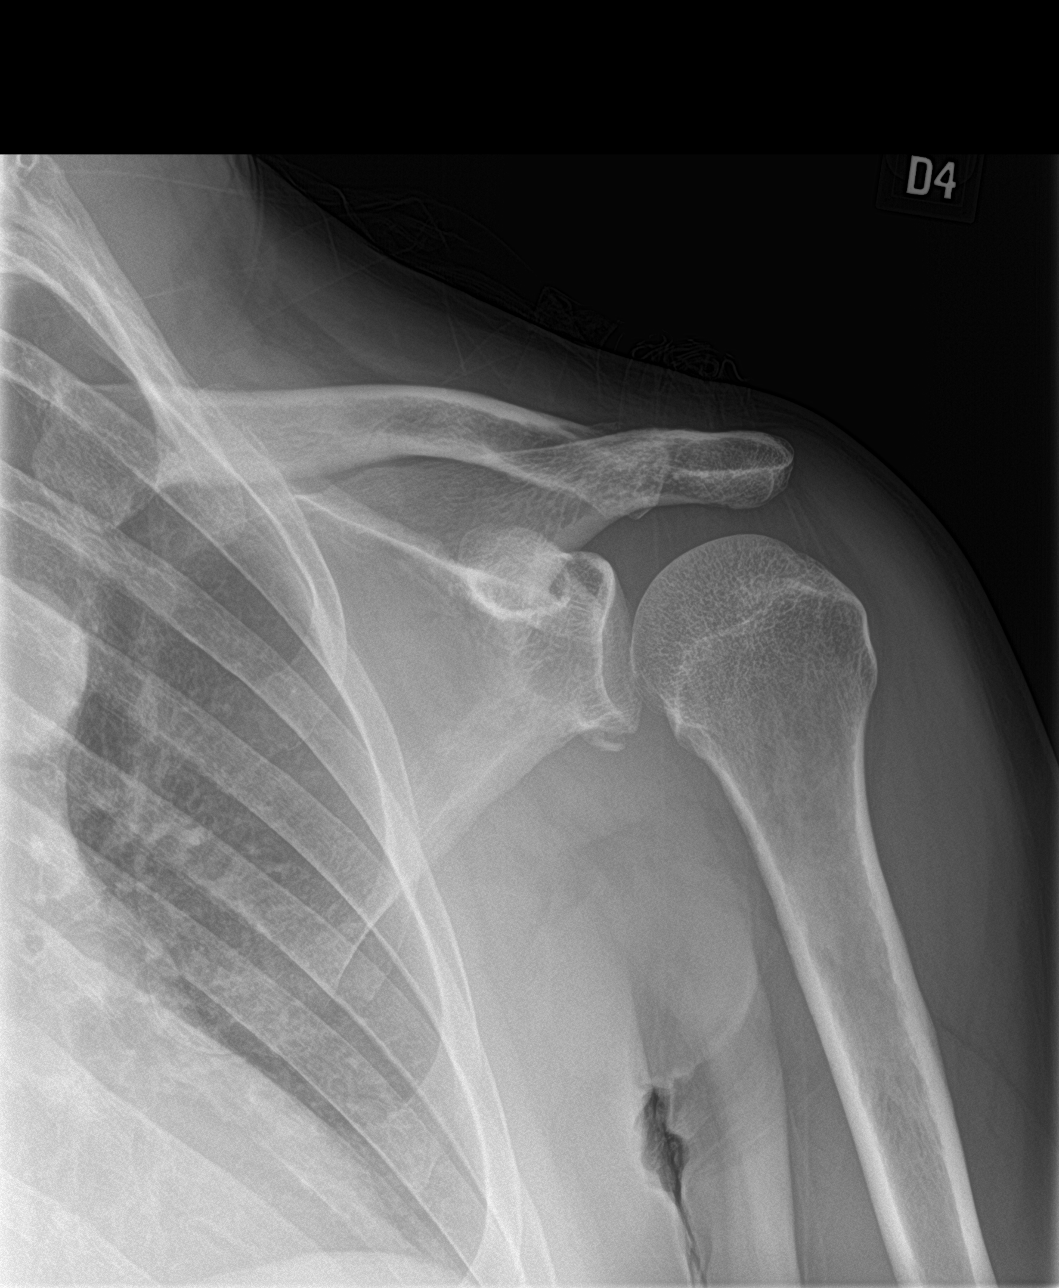
[im 2/3]
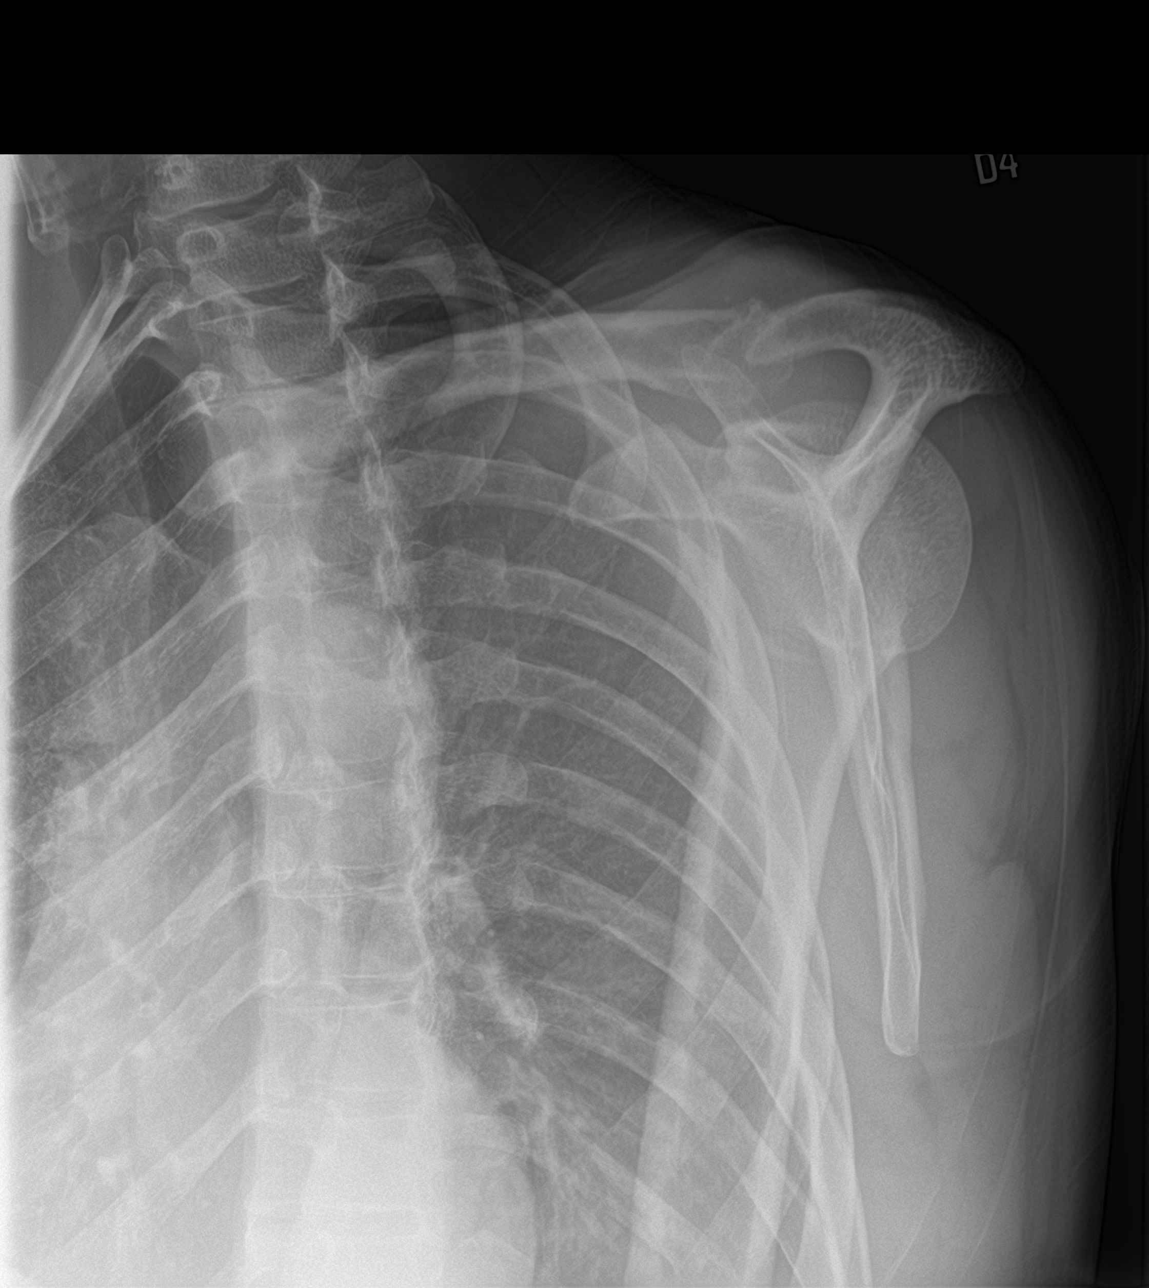
[im 3/3]
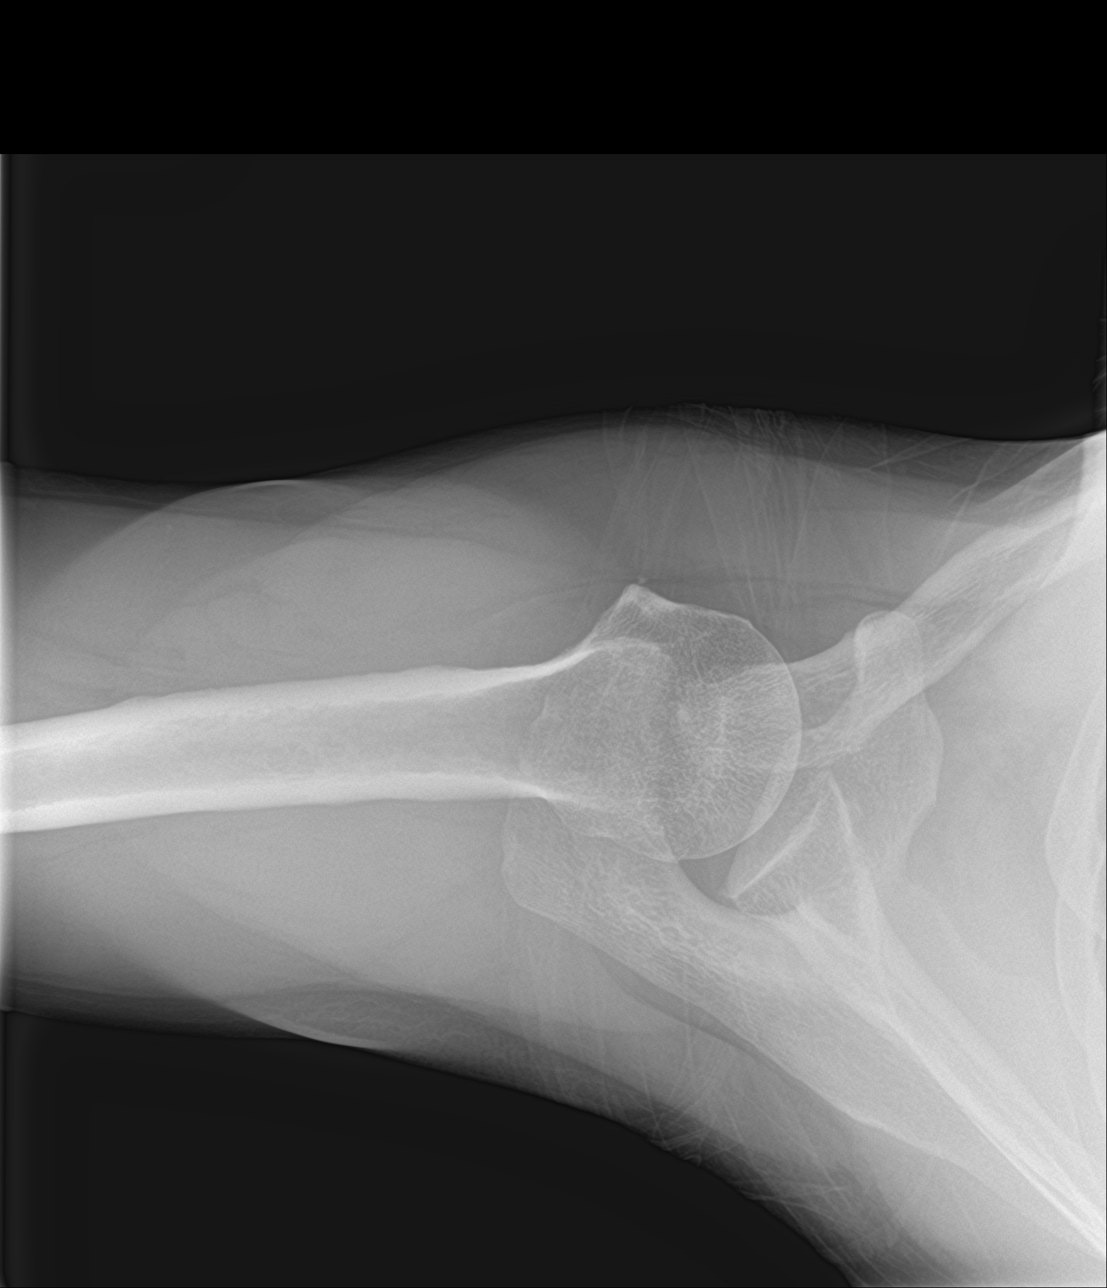

[3 of 3 positions shown; findings below may reference images not displayed]

FINDINGS: Normal alignment of the shoulder joint. There is a small minimally
displaced fracture fragment along the anterior inferior glenoid. The
visualized soft tissues are unremarkable.
IMPRESSION: Minimally displaced fracture of the anterior inferior glenoid
(osseous Mitikka).
